# Patient Record
Sex: Male | Born: 1985 | Race: Black or African American | Hispanic: No | Marital: Single | Smoking: Current every day smoker
Health system: Southern US, Community
[De-identification: ages and names within clinical notes are randomized; demographics above are authoritative.]

## PROBLEM LIST (undated history)

## (undated) DIAGNOSIS — L309 Dermatitis, unspecified: Secondary | ICD-10-CM

---
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
## 2007-09-07 ENCOUNTER — Emergency Department (HOSPITAL_COMMUNITY): Admission: EM | Admit: 2007-09-07 | Discharge: 2007-09-07 | Payer: Self-pay | Admitting: Emergency Medicine

## 2008-03-12 ENCOUNTER — Emergency Department (HOSPITAL_COMMUNITY): Admission: EM | Admit: 2008-03-12 | Discharge: 2008-03-12 | Payer: Self-pay | Admitting: Family Medicine

## 2009-04-25 ENCOUNTER — Emergency Department (HOSPITAL_COMMUNITY): Admission: EM | Admit: 2009-04-25 | Discharge: 2009-04-25 | Payer: Self-pay | Admitting: Emergency Medicine

## 2011-10-03 LAB — I-STAT 8, (EC8 V) (CONVERTED LAB)
BUN: 6
Bicarbonate: 29.8 — ABNORMAL HIGH
Chloride: 104
HCT: 46
Hemoglobin: 15.6
Operator id: 294501
Potassium: 4.1
Sodium: 140

## 2011-10-03 LAB — URINALYSIS, ROUTINE W REFLEX MICROSCOPIC
Bilirubin Urine: NEGATIVE
Glucose, UA: NEGATIVE
Hgb urine dipstick: NEGATIVE
Ketones, ur: NEGATIVE
Protein, ur: NEGATIVE
pH: 5.5

## 2014-06-18 ENCOUNTER — Emergency Department (HOSPITAL_COMMUNITY)
Admission: EM | Admit: 2014-06-18 | Discharge: 2014-06-18 | Discharge status: Against Medical Advice | Payer: Self-pay | Attending: Emergency Medicine | Admitting: Emergency Medicine

## 2014-06-18 DIAGNOSIS — H5710 Ocular pain, unspecified eye: Secondary | ICD-10-CM | POA: Insufficient documentation

## 2015-03-29 ENCOUNTER — Emergency Department (HOSPITAL_COMMUNITY)
Admission: EM | Admit: 2015-03-29 | Discharge: 2015-03-29 | Disposition: A | Discharge status: Home / Self Care | Payer: Self-pay | Attending: Emergency Medicine | Admitting: Emergency Medicine

## 2015-03-29 ENCOUNTER — Encounter (HOSPITAL_COMMUNITY): Payer: Self-pay | Admitting: Emergency Medicine

## 2015-03-29 DIAGNOSIS — Z720 Tobacco use: Secondary | ICD-10-CM | POA: Insufficient documentation

## 2015-03-29 DIAGNOSIS — X58XXXA Exposure to other specified factors, initial encounter: Secondary | ICD-10-CM | POA: Insufficient documentation

## 2015-03-29 DIAGNOSIS — Y9289 Other specified places as the place of occurrence of the external cause: Secondary | ICD-10-CM | POA: Insufficient documentation

## 2015-03-29 DIAGNOSIS — S29012A Strain of muscle and tendon of back wall of thorax, initial encounter: Secondary | ICD-10-CM | POA: Insufficient documentation

## 2015-03-29 DIAGNOSIS — Y9389 Activity, other specified: Secondary | ICD-10-CM | POA: Insufficient documentation

## 2015-03-29 DIAGNOSIS — S233XXA Sprain of ligaments of thoracic spine, initial encounter: Secondary | ICD-10-CM | POA: Insufficient documentation

## 2015-03-29 DIAGNOSIS — Y990 Civilian activity done for income or pay: Secondary | ICD-10-CM | POA: Insufficient documentation

## 2015-03-29 LAB — URINALYSIS, ROUTINE W REFLEX MICROSCOPIC
Bilirubin Urine: NEGATIVE
Glucose, UA: NEGATIVE mg/dL
Hgb urine dipstick: NEGATIVE
Ketones, ur: 15 mg/dL — AB
Leukocytes, UA: NEGATIVE
Nitrite: NEGATIVE
Protein, ur: NEGATIVE mg/dL
Specific Gravity, Urine: 1.028 (ref 1.005–1.030)
Urobilinogen, UA: 0.2 mg/dL (ref 0.0–1.0)
pH: 5.5 (ref 5.0–8.0)

## 2015-03-29 MED ORDER — CYCLOBENZAPRINE HCL 10 MG PO TABS: 10.0000 mg | ORAL_TABLET | Freq: Two times a day (BID) | ORAL | Status: DC | PRN

## 2015-03-29 NOTE — ED Notes (Signed)
The patient said he has been having back pain for about 8 months.  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  He rates his pain 10/10.  He denies any urinary symptoms.

## 2015-03-29 NOTE — ED Provider Notes (Signed)
CSN: [id number]     Arrival date & time 03/29/15  0238 History   First MD Initiated Contact with Patient 03/29/15 [phone]     Chief Complaint  Patient presents with  . Back Pain    The patient said he has been having back pain for about 8 months.  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  He rates his pain 10/10.     (Consider location/radiation/quality/duration/timing/severity/associated sxs/prior Treatment) Patient is a 29 y.o. male presenting with back pain.  Back Pain Location:  Thoracic spine Quality:  Aching and shooting Radiates to: intermittently to bil arms. Pain severity:  Moderate Pain is:  Worse during the night (when patient works) Onset quality:  Gradual Duration:  8 months Timing:  Intermittent Progression:  Unchanged Chronicity:  Chronic Context comment:  Works at a job with heavy lifting and bending frequently Relieved by: days off. Worsened by:  Movement, standing, sitting, bending and deep breathing Ineffective treatments:  None tried Associated symptoms: no abdominal pain, no bladder incontinence, no bowel incontinence, no chest pain, no fever, no headaches, no numbness, no paresthesias, no perianal numbness and no tingling     History reviewed. No pertinent past medical history. History reviewed. No pertinent past surgical history. History reviewed. No pertinent family history. History  Substance Use Topics  . Smoking status: Current Every Day Smoker    Types: Cigarettes  . Smokeless tobacco: Not on file  . Alcohol Use: Yes     Comment: occ    Review of Systems  Constitutional: Negative for fever.  Cardiovascular: Negative for chest pain.  Gastrointestinal: Negative for abdominal pain and bowel incontinence.  Genitourinary: Negative for bladder incontinence.  Musculoskeletal: Positive for back pain.  Neurological: Negative for tingling, numbness, headaches and paresthesias.  All  other systems reviewed and are negative.     Allergies  Review of patient's allergies indicates not on file.  Home Medications   Prior to Admission medications   Medication Sig Start Date End Date Taking? Authorizing Provider  cyclobenzaprine (FLEXERIL) 10 MG tablet Take 1 tablet (10 mg total) by mouth 2 (two) times daily as needed for muscle spasms. 03/29/15   Mirian Mo, MD   BP 128/61 mmHg  Pulse 60  Temp(Src) 98.1 F (36.7 C) (Oral)  Resp 16  SpO2 97% Physical Exam  Constitutional: He is oriented to person, place, and time. He appears well-developed and well-nourished.  HENT:  Head: Normocephalic and atraumatic.  Eyes: Conjunctivae and EOM are normal.  Neck: Normal range of motion. Neck supple.  Cardiovascular: Normal rate, regular rhythm and normal heart sounds.   Pulmonary/Chest: Effort normal and breath sounds normal. No respiratory distress.  Abdominal: He exhibits no distension. There is no tenderness. There is no rebound and no guarding.  Musculoskeletal: Normal range of motion.       Cervical back: Normal.       Thoracic back: He exhibits tenderness (R paraspinal).       Lumbar back: Normal.  Neurological: He is alert and oriented to person, place, and time.  Skin: Skin is warm and dry.  Vitals reviewed.   ED Course  Procedures (including critical care time) Labs Review Labs Reviewed  URINALYSIS, ROUTINE W REFLEX MICROSCOPIC - Abnormal; Notable for the following:    Ketones, ur 15 (*)    All other components within normal limits    Imaging Review No results found.   EKG  Interpretation None      MDM   Final diagnoses:  Thoracic sprain and strain, initial encounter    29 y.o. male without pertinent PMH presents with back pain as above.  Symptoms MSK: worse with movement, palpation, improve with rest.  No pain while sitting still.  No significant change to prompt visit today.  Doubt acs, pe, pyelo, PNA, PTX, cauda equina, cord pathology given  chronic nature and clear MSK origin without systemic symptoms.  DC home in stable condition with standard return precautions.    I have reviewed all laboratory and imaging studies if ordered as above  1. Thoracic sprain and strain, initial encounter         Mirian MoMatthew Yetunde Leis, MD 03/29/15 0500

## 2015-03-29 NOTE — Discharge Instructions (Signed)
Back Pain, Adult Low back pain is very common. About 1 in 5 people have back pain.The cause of low back pain is rarely dangerous. The pain often gets better over time.About half of people with a sudden onset of back pain feel better in just 2 weeks. About 8 in 10 people feel better by 6 weeks.  CAUSES Some common causes of back pain include:  Strain of the muscles or ligaments supporting the spine.  Wear and tear (degeneration) of the spinal discs.  Arthritis.  Direct injury to the back. DIAGNOSIS Most of the time, the direct cause of low back pain is not known.However, back pain can be treated effectively even when the exact cause of the pain is unknown.Answering your caregiver's questions about your overall health and symptoms is one of the most accurate ways to make sure the cause of your pain is not dangerous. If your caregiver needs more information, he or she may order lab work or imaging tests (X-rays or MRIs).However, even if imaging tests show changes in your back, this usually does not require surgery. HOME CARE INSTRUCTIONS For many people, back pain returns.Since low back pain is rarely dangerous, it is often a condition that people can learn to manageon their own.   Remain active. It is stressful on the back to sit or stand in one place. Do not sit, drive, or stand in one place for more than 30 minutes at a time. Take short walks on level surfaces as soon as pain allows.Try to increase the length of time you walk each day.  Do not stay in bed.Resting more than 1 or 2 days can delay your recovery.  Do not avoid exercise or work.Your body is made to move.It is not dangerous to be active, even though your back may hurt.Your back will likely heal faster if you return to being active before your pain is gone.  Pay attention to your body when you bend and lift. Many people have less discomfortwhen lifting if they bend their knees, keep the load close to their bodies,and  avoid twisting. Often, the most comfortable positions are those that put less stress on your recovering back.  Find a comfortable position to sleep. Use a firm mattress and lie on your side with your knees slightly bent. If you lie on your back, put a pillow under your knees.  Only take over-the-counter or prescription medicines as directed by your caregiver. Over-the-counter medicines to reduce pain and inflammation are often the most helpful.Your caregiver may prescribe muscle relaxant drugs.These medicines help dull your pain so you can more quickly return to your normal activities and healthy exercise.  Put ice on the injured area.  Put ice in a plastic bag.  Place a towel between your skin and the bag.  Leave the ice on for 15-20 minutes, 03-04 times a day for the first 2 to 3 days. After that, ice and heat may be alternated to reduce pain and spasms.  Ask your caregiver about trying back exercises and gentle massage. This may be of some benefit.  Avoid feeling anxious or stressed.Stress increases muscle tension and can worsen back pain.It is important to recognize when you are anxious or stressed and learn ways to manage it.Exercise is a great option. SEEK MEDICAL CARE IF:  You have pain that is not relieved with rest or medicine.  You have pain that does not improve in 1 week.  You have new symptoms.  You are generally not feeling well. SEEK   IMMEDIATE MEDICAL CARE IF:   You have pain that radiates from your back into your legs.  You develop new bowel or bladder control problems.  You have unusual weakness or numbness in your arms or legs.  You develop nausea or vomiting.  You develop abdominal pain.  You feel faint. Document Released: 12/09/2005 Document Revised: 06/09/2012 Document Reviewed: 04/12/2014 Exit[employer] Patient Information 2015 [employer]. This information is not intended to replace advice given to you by your health care provider. Make sure you  discuss any questions you have with your health care provider.  

## 2015-07-01 ENCOUNTER — Encounter (HOSPITAL_COMMUNITY): Payer: Self-pay | Admitting: Emergency Medicine

## 2015-07-01 ENCOUNTER — Emergency Department (INDEPENDENT_AMBULATORY_CARE_PROVIDER_SITE_OTHER)
Admission: EM | Admit: 2015-07-01 | Discharge: 2015-07-01 | Disposition: A | Discharge status: Home / Self Care | Payer: Self-pay | Source: Home / Self Care

## 2015-07-01 DIAGNOSIS — S40022A Contusion of left upper arm, initial encounter: Secondary | ICD-10-CM

## 2015-07-01 DIAGNOSIS — Z23 Encounter for immunization: Secondary | ICD-10-CM

## 2015-07-01 DIAGNOSIS — M79644 Pain in right finger(s): Secondary | ICD-10-CM

## 2015-07-01 DIAGNOSIS — S61219A Laceration without foreign body of unspecified finger without damage to nail, initial encounter: Secondary | ICD-10-CM

## 2015-07-01 MED ORDER — TETANUS-DIPHTH-ACELL PERTUSSIS 5-2.5-18.5 LF-MCG/0.5 IM SUSP
0.5000 mL | Freq: Once | INTRAMUSCULAR | Status: AC
  Administered 2015-07-01: 0.5 mL via INTRAMUSCULAR

## 2015-07-01 MED ORDER — CEPHALEXIN 500 MG PO CAPS: 500.0000 mg | ORAL_CAPSULE | Freq: Three times a day (TID) | ORAL | Status: DC

## 2015-07-01 MED ORDER — MUPIROCIN 2 % EX OINT: 1.0000 "application " | TOPICAL_OINTMENT | Freq: Three times a day (TID) | CUTANEOUS | Status: DC
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
## 2016-02-21 ENCOUNTER — Emergency Department (INDEPENDENT_AMBULATORY_CARE_PROVIDER_SITE_OTHER)
Admission: EM | Admit: 2016-02-21 | Discharge: 2016-02-21 | Disposition: A | Discharge status: Home / Self Care | Payer: Self-pay | Source: Home / Self Care | Attending: Family Medicine | Admitting: Family Medicine

## 2016-02-21 ENCOUNTER — Encounter (HOSPITAL_COMMUNITY): Payer: Self-pay | Admitting: *Deleted

## 2016-02-21 DIAGNOSIS — R69 Illness, unspecified: Principal | ICD-10-CM

## 2016-02-21 DIAGNOSIS — J111 Influenza due to unidentified influenza virus with other respiratory manifestations: Secondary | ICD-10-CM

## 2016-02-21 MED ORDER — ONDANSETRON HCL 4 MG PO TABS: 4.0000 mg | ORAL_TABLET | Freq: Four times a day (QID) | ORAL | Status: DC

## 2016-02-21 MED ORDER — IBUPROFEN 800 MG PO TABS: 800.0000 mg | ORAL_TABLET | Freq: Three times a day (TID) | ORAL | Status: DC | PRN

## 2016-02-21 NOTE — ED Notes (Signed)
Not   In  wr

## 2016-02-21 NOTE — ED Notes (Signed)
Pt  Reports  Body  Aches          Chills  Pain  In  Sides  And  Back  With  Onset of  Symptoms  X  3  Days  Ago   -    Pt sitting  Upright on the  Exam table   Speaking in  Complete  sentances

## 2016-02-21 NOTE — ED Provider Notes (Signed)
CSN: [id number]     Arrival date & time 02/21/16  1846 History   First MD Initiated Contact with Patient 02/21/16 1942     Chief Complaint  Patient presents with  . Generalized Body Aches   (Consider location/radiation/quality/duration/timing/severity/associated sxs/prior Treatment) Patient is a 30 y.o. male presenting with flu symptoms. The history is provided by the patient.  Influenza Presenting symptoms: cough, fatigue, fever, myalgias, nausea and vomiting   Presenting symptoms: no shortness of breath   Severity:  Moderate Onset quality:  Gradual Duration:  4 days Progression:  Worsening Chronicity:  New Relieved by:  None tried Worsened by:  Nothing tried Associated symptoms: chills, decreased appetite and nasal congestion     History reviewed. No pertinent past medical history. History reviewed. No pertinent past surgical history. History reviewed. No pertinent family history. Social History  Substance Use Topics  . Smoking status: Current Every Day Smoker    Types: Cigarettes  . Smokeless tobacco: None  . Alcohol Use: Yes     Comment: occ    Review of Systems  Constitutional: Positive for fever, chills, appetite change, fatigue and decreased appetite. Negative for activity change.  HENT: Positive for congestion and postnasal drip.   Respiratory: Positive for cough. Negative for shortness of breath.   Cardiovascular: Negative.   Gastrointestinal: Positive for nausea and vomiting.  Genitourinary: Negative.   Musculoskeletal: Positive for myalgias.  All other systems reviewed and are negative.   Allergies  Review of patient's allergies indicates no known allergies.  Home Medications   Prior to Admission medications   Medication Sig Start Date End Date Taking? Authorizing Provider  cephALEXin (KEFLEX) 500 MG capsule Take 1 capsule (500 mg total) by mouth 3 (three) times daily. 07/01/15   Elvina Sidle, MD  cyclobenzaprine (FLEXERIL) 10 MG tablet Take 1 tablet  (10 mg total) by mouth 2 (two) times daily as needed for muscle spasms. 03/29/15   Mirian Mo, MD  ibuprofen (ADVIL,MOTRIN) 800 MG tablet Take 1 tablet (800 mg total) by mouth every 8 (eight) hours as needed. For fever and aching with influenza 02/21/16   Linna Hoff, MD  mupirocin ointment (BACTROBAN) 2 % Apply 1 application topically 3 (three) times daily. 07/01/15   Elvina Sidle, MD  ondansetron (ZOFRAN) 4 MG tablet Take 1 tablet (4 mg total) by mouth every 6 (six) hours. Prn n/v. 02/21/16   Linna Hoff, MD   Meds Ordered and Administered this Visit  Medications - No data to display  BP 128/82 mmHg  Pulse 112  Temp(Src) 99.6 F (37.6 C) (Oral)  Resp 18  SpO2 100% No data found.   Physical Exam  Constitutional: He is oriented to person, place, and time. He appears well-developed and well-nourished. No distress.  HENT:  Right Ear: External ear normal.  Left Ear: External ear normal.  Mouth/Throat: Oropharynx is clear and moist.  Neck: Normal range of motion. Neck supple.  Cardiovascular: Normal rate, normal heart sounds and intact distal pulses.   Pulmonary/Chest: Effort normal and breath sounds normal.  Abdominal: Soft. Bowel sounds are normal. There is no tenderness.  Lymphadenopathy:    He has no cervical adenopathy.  Neurological: He is alert and oriented to person, place, and time.  Skin: Skin is warm and dry.  Nursing note and vitals reviewed.   ED Course  Procedures (including critical care time)  Labs Review Labs Reviewed - No data to display  Imaging Review No results found.   Visual Acuity Review  Right  Eye Distance:   Left Eye Distance:   Bilateral Distance:    Right Eye Near:   Left Eye Near:    Bilateral Near:         MDM   1. Influenza-like illness        Linna Hoff, MD 02/21/16 [phone]

## 2016-10-15 ENCOUNTER — Emergency Department (HOSPITAL_COMMUNITY)
Admission: EM | Admit: 2016-10-15 | Discharge: 2016-10-15 | Disposition: A | Discharge status: Home / Self Care | Payer: Self-pay | Attending: Emergency Medicine | Admitting: Emergency Medicine

## 2016-10-15 ENCOUNTER — Emergency Department (HOSPITAL_COMMUNITY): Payer: Self-pay

## 2016-10-15 ENCOUNTER — Encounter (HOSPITAL_COMMUNITY): Payer: Self-pay | Admitting: Emergency Medicine

## 2016-10-15 DIAGNOSIS — Y9389 Activity, other specified: Secondary | ICD-10-CM | POA: Insufficient documentation

## 2016-10-15 DIAGNOSIS — S62515A Nondisplaced fracture of proximal phalanx of left thumb, initial encounter for closed fracture: Secondary | ICD-10-CM

## 2016-10-15 DIAGNOSIS — S62525A Nondisplaced fracture of distal phalanx of left thumb, initial encounter for closed fracture: Secondary | ICD-10-CM | POA: Insufficient documentation

## 2016-10-15 DIAGNOSIS — Y929 Unspecified place or not applicable: Secondary | ICD-10-CM | POA: Insufficient documentation

## 2016-10-15 DIAGNOSIS — Y999 Unspecified external cause status: Secondary | ICD-10-CM | POA: Insufficient documentation

## 2016-10-15 DIAGNOSIS — W228XXA Striking against or struck by other objects, initial encounter: Secondary | ICD-10-CM | POA: Insufficient documentation

## 2016-10-15 MED ORDER — HYDROCODONE-ACETAMINOPHEN 5-325 MG PO TABS
1.0000 | ORAL_TABLET | Freq: Once | ORAL | Status: AC
  Administered 2016-10-15: 1 via ORAL
  Filled 2016-10-15: qty 1

## 2016-10-15 MED ORDER — HYDROCODONE-ACETAMINOPHEN 5-325 MG PO TABS: 1.0000 | ORAL_TABLET | ORAL | 0 refills | Status: DC | PRN

## 2016-10-15 NOTE — ED Notes (Signed)
Ortho tech paged  

## 2016-10-15 NOTE — ED Triage Notes (Signed)
Patient injured his left hand after he hit a glass cup this evening , presents with swelling/pain at left hand , skin intact .

## 2016-10-15 NOTE — ED Provider Notes (Signed)
[facility]-EMERGENCY DEPT Provider Note   CSN: [id number] Arrival date & time: 10/15/16  0015     History   Chief Complaint Chief Complaint  Patient presents with  . Hand Injury    HPI Derrick Hurst is a 30 y.o. male.  Derrick Hurst is a 30 y.o. male presents to ED with complaint of left hand pain and swelling s/p injury. Patient reports this evening that he  "slammed" a glass cup on the counter and had subsequent pain and swelling in left thumb with radiation into left wrist. Pain is constant described as an "electric/burning sensation." No aggravating or alleviating factors. He tried applying frozen hamburger meat to area with minimal relief.  He also notes bruising to the palmar surface of his left hand. He endorses paresthesias and weakness. Denies fever, warmth, or wound. Patient is right hand dominant.      History reviewed. No pertinent past medical history.  There are no active problems to display for this patient.   History reviewed. No pertinent surgical history.     Home Medications    Prior to Admission medications   Medication Sig Start Date End Date Taking? Authorizing Provider  cephALEXin (KEFLEX) 500 MG capsule Take 1 capsule (500 mg total) by mouth 3 (three) times daily. 07/01/15   Elvina Sidle, MD  cyclobenzaprine (FLEXERIL) 10 MG tablet Take 1 tablet (10 mg total) by mouth 2 (two) times daily as needed for muscle spasms. 03/29/15   Mirian Mo, MD  HYDROcodone-acetaminophen (NORCO/VICODIN) 5-325 MG tablet Take 1 tablet by mouth every 4 (four) hours as needed. 10/15/16   Lona Kettle, PA-C  ibuprofen (ADVIL,MOTRIN) 800 MG tablet Take 1 tablet (800 mg total) by mouth every 8 (eight) hours as needed. For fever and aching with influenza 02/21/16   Linna Hoff, MD  mupirocin ointment (BACTROBAN) 2 % Apply 1 application topically 3 (three) times daily. 07/01/15   Elvina Sidle, MD  ondansetron (ZOFRAN) 4 MG tablet Take 1 tablet (4 mg total) by mouth  every 6 (six) hours. Prn n/v. 02/21/16   Linna Hoff, MD    Family History No family history on file.  Social History Social History  Substance Use Topics  . Smoking status: Current Every Day Smoker    Types: Cigarettes  . Smokeless tobacco: Not on file  . Alcohol use Yes     Comment: occ     Allergies   Review of patient's allergies indicates no known allergies.   Review of Systems Review of Systems  Constitutional: Negative for fever.  Musculoskeletal: Positive for arthralgias and joint swelling.  Skin: Positive for color change. Negative for wound.  Neurological: Positive for weakness. Negative for numbness.       Paresthesias, "electric shock sensation"     Physical Exam Updated Vital Signs BP 147/91 (BP Location: Right Arm)   Pulse 83   Temp 97.9 F (36.6 C) (Oral)   Resp 18   Ht 5\' 10"  (1.778 m)   Wt 81.6 kg   SpO2 100%   BMI 25.83 kg/m   Physical Exam  Constitutional: He appears well-developed and well-nourished. No distress.  HENT:  Head: Normocephalic and atraumatic.  Eyes: Conjunctivae are normal. No scleral icterus.  Neck: Normal range of motion.  Pulmonary/Chest: Effort normal. No respiratory distress.  Musculoskeletal:       Left hand: He exhibits tenderness and swelling.  Left hand: swelling appreciated most significantly at thumb; ecchymosis noted to palmer surface at thenar eminence; no  open wounds. Diffuse TTP of left hand and wrist. Limited ROM and strength assessment secondary to pain. Sensation intact. Capillary refill <3seconds.   Neurological: He is alert.  Skin: Skin is warm and dry. He is not diaphoretic.  Psychiatric: He has a normal mood and affect. His behavior is normal.     ED Treatments / Results  Labs (all labs ordered are listed, but only abnormal results are displayed) Labs Reviewed - No data to display  EKG  EKG Interpretation None       Radiology Dg Hand Complete Left  Result Date: 10/15/2016 CLINICAL DATA:   Injury pain at the thumb. EXAM: LEFT HAND - COMPLETE 3+ VIEW COMPARISON:  04/25/2009 FINDINGS: There is a mildly comminuted intra-articular fracture involving the base of the first proximal phalanx. No angulation. No subluxation. No radiopaque foreign body. IMPRESSION: Comminuted intra-articular fracture involving the base of the first proximal phalanx Electronically Signed   By: Jasmine PangKim  Fujinaga M.D.   On: 10/15/2016 02:33    Procedures Procedures (including critical care time)  Medications Ordered in ED Medications  HYDROcodone-acetaminophen (NORCO/VICODIN) 5-325 MG per tablet 1 tablet (1 tablet Oral Given 10/15/16 0142)     Initial Impression / Assessment and Plan / ED Course  I have reviewed the triage vital signs and the nursing notes.  Pertinent labs & imaging results that were available during my care of the patient were reviewed by me and considered in my medical decision making (see chart for details).  Clinical Course  Value Comment By Time  DG Hand Complete Left Fracture at base of 1st proximal phalanx.  Lona Kettleshley Laurel Meri Pelot, [place]-C 10/24 [phone]    Patient presents to ED with complaint of left hand pain s/p hitting a glass. Patient is afebrile and non-toxic appearing in NAD. Vital signs remarkable for mildly elevated blood pressure, otherwise stable. Swelling to left hand, moreso at left thumb. Ecchymosis appreciated at left thenar eminence. Diffuse TTP of hand. Limited ROM and strength secondary to pain. Neurovascularly intact.  X-ray remarkable for comminuted intra-articular fracture at base of first proximal phalanx. Discussed results and plan with patient. Patient placed in splint and arm sling. Neurovascularly intact following splint placement. Close follow up with ortho, call in am. RICE therapy and pain management discussed. Rx vicodin for severe pain. Review of [place] controlled substance database shows no recent narcotic rx. Return precautions given. Pt voiced understanding and is  agreeable.    Final Clinical Impressions(s) / ED Diagnoses   Final diagnoses:  Closed nondisplaced fracture of proximal phalanx of left thumb, initial encounter    New Prescriptions New Prescriptions   HYDROCODONE-ACETAMINOPHEN (NORCO/VICODIN) 5-325 MG TABLET    Take 1 tablet by mouth every 4 (four) hours as needed.     Lona KettleAshley Laurel Evett Kassa, PA-C 10/15/16 [phone]    Tomasita CrumbleAdeleke Oni, MD 10/15/16 [phone]

## 2016-10-15 NOTE — ED Notes (Signed)
Pt verbalized understanding discharge instructions and denies any further needs or questions at this time. VS stable, ambulatory and steady gait.   

## 2016-10-15 NOTE — ED Notes (Signed)
EDP at bedside  

## 2016-10-15 NOTE — Discharge Instructions (Signed)
Read the information below.  You have a fracture in your thumb. You are being placed in a splint ad given an arm sling. It is very important that you follow up with Hand Surgery, please call in the morning for further management.  You can ice and elevated for 20 minute increments for the next 48 hours.  You can take motrin 400mg  every 6hrs for mild to moderate pain. I have prescribed vicodin for severe pain.  Use the prescribed medication as directed. Please discuss all new medications with your pharmacist.   You may return to the Emergency Department at any time for worsening condition or any new symptoms that concern you. Return to ED if you develop worsening swelling, uncontrolled pain, discoloration in your hands, or loss of sensation.

## 2017-04-18 ENCOUNTER — Encounter (HOSPITAL_BASED_OUTPATIENT_CLINIC_OR_DEPARTMENT_OTHER): Payer: Self-pay | Admitting: *Deleted

## 2017-04-18 ENCOUNTER — Emergency Department (HOSPITAL_BASED_OUTPATIENT_CLINIC_OR_DEPARTMENT_OTHER): Payer: Self-pay

## 2017-04-18 ENCOUNTER — Emergency Department (HOSPITAL_BASED_OUTPATIENT_CLINIC_OR_DEPARTMENT_OTHER)
Admission: EM | Admit: 2017-04-18 | Discharge: 2017-04-19 | Disposition: A | Discharge status: Home / Self Care | Payer: Self-pay | Attending: Emergency Medicine | Admitting: Emergency Medicine

## 2017-04-18 DIAGNOSIS — S6391XA Sprain of unspecified part of right wrist and hand, initial encounter: Secondary | ICD-10-CM | POA: Insufficient documentation

## 2017-04-18 DIAGNOSIS — Y999 Unspecified external cause status: Secondary | ICD-10-CM | POA: Insufficient documentation

## 2017-04-18 DIAGNOSIS — W500XXA Accidental hit or strike by another person, initial encounter: Secondary | ICD-10-CM | POA: Insufficient documentation

## 2017-04-18 DIAGNOSIS — Y9389 Activity, other specified: Secondary | ICD-10-CM | POA: Insufficient documentation

## 2017-04-18 DIAGNOSIS — F17210 Nicotine dependence, cigarettes, uncomplicated: Secondary | ICD-10-CM | POA: Insufficient documentation

## 2017-04-18 DIAGNOSIS — Y92149 Unspecified place in prison as the place of occurrence of the external cause: Secondary | ICD-10-CM | POA: Insufficient documentation
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
## 2017-04-18 NOTE — ED Notes (Signed)
ED Provider at bedside. 
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
## 2017-04-19 MED ORDER — HYDROCODONE-ACETAMINOPHEN 5-325 MG PO TABS
1.0000 | ORAL_TABLET | Freq: Once | ORAL | Status: AC
Start: 2017-04-19 — End: 2017-04-19
  Administered 2017-04-19: 1 via ORAL
  Filled 2017-04-19: qty 1

## 2017-04-19 MED ORDER — IBUPROFEN 800 MG PO TABS
800.0000 mg | ORAL_TABLET | Freq: Once | ORAL | Status: AC
  Administered 2017-04-19: 800 mg via ORAL
  Filled 2017-04-19: qty 1

## 2017-04-19 MED ORDER — IBUPROFEN 600 MG PO TABS: 600.0000 mg | ORAL_TABLET | Freq: Three times a day (TID) | ORAL | 0 refills | Status: DC | PRN

## 2017-04-19 NOTE — ED Notes (Signed)
Pt tolerated well. PMS intact before and after.

## 2017-04-19 NOTE — ED Notes (Signed)
Pt discharged to home NAD.  

## 2018-03-26 ENCOUNTER — Emergency Department (HOSPITAL_BASED_OUTPATIENT_CLINIC_OR_DEPARTMENT_OTHER)
Admission: EM | Admit: 2018-03-26 | Discharge: 2018-03-26 | Disposition: A | Discharge status: Home / Self Care | Payer: Self-pay | Attending: Emergency Medicine | Admitting: Emergency Medicine

## 2018-03-26 ENCOUNTER — Encounter (HOSPITAL_BASED_OUTPATIENT_CLINIC_OR_DEPARTMENT_OTHER): Payer: Self-pay | Admitting: *Deleted

## 2018-03-26 ENCOUNTER — Other Ambulatory Visit: Payer: Self-pay

## 2018-03-26 DIAGNOSIS — F17210 Nicotine dependence, cigarettes, uncomplicated: Secondary | ICD-10-CM | POA: Insufficient documentation

## 2018-03-26 DIAGNOSIS — R112 Nausea with vomiting, unspecified: Secondary | ICD-10-CM | POA: Insufficient documentation

## 2018-03-26 DIAGNOSIS — R197 Diarrhea, unspecified: Secondary | ICD-10-CM | POA: Insufficient documentation

## 2018-03-26 MED ORDER — SODIUM CHLORIDE 0.9 % IV BOLUS
1000.0000 mL | Freq: Once | INTRAVENOUS | Status: AC
  Administered 2018-03-26: 1000 mL via INTRAVENOUS

## 2018-03-26 MED ORDER — ONDANSETRON 4 MG PO TBDP: 4.0000 mg | ORAL_TABLET | Freq: Three times a day (TID) | ORAL | 0 refills | Status: DC | PRN

## 2018-03-26 MED ORDER — ONDANSETRON HCL 4 MG/2ML IJ SOLN
4.0000 mg | Freq: Once | INTRAMUSCULAR | Status: AC
  Administered 2018-03-26: 4 mg via INTRAVENOUS
  Filled 2018-03-26: qty 2

## 2018-03-26 NOTE — ED Notes (Signed)
Pt. Feeling better and nausea is better now.

## 2018-03-26 NOTE — Discharge Instructions (Signed)
You were seen in the emergency department with N/V/D and abdominal discomfort. Your symptoms improved following fluids and zofran (anti-nausea medication). We suspect that your symptoms are likely related to a viral GI bug. Your symptoms should gradually improve. We have given you a prescription for zofran, this is the anti-nausea medication we gave you in the ER. You may take zofran once every 8 hours as needed for vomiting. Place the tablet underneath of your tongue and allow it to dissolve.   We have prescribed you new medication(s) today. Discuss the medications prescribed today with your pharmacist as they can have adverse effects and interactions with your other medicines including over the counter and prescribed medications. Seek medical evaluation if you start to experience new or abnormal symptoms after taking one of these medicines, seek care immediately if you start to experience difficulty breathing, feeling of your throat closing, facial swelling, or rash as these could be indications of a more serious allergic reaction.   Follow up with your primary care doctor in 3-5 days for re-evaluation. Return to the ER for any new or worsening symptoms including, but not limited to fever, inability to keep down fluids, blood in your stool, worsening or localizing abdominal pain, or any other concerns you may have.

## 2018-03-26 NOTE — ED Triage Notes (Signed)
He woke with diarrhea and vomiting at 3am. Abdominal pain a few hours later.

## 2018-03-26 NOTE — ED Provider Notes (Signed)
[facility] EMERGENCY DEPARTMENT Provider Note   CSN: [id number] Arrival date & time: 03/26/18  1731     History   Chief Complaint Chief Complaint  Patient presents with  . Abdominal Pain  . Emesis    HPI Derrick Hurst is a 32 y.o. male with a hx of tobacco abuse who presents to the ED with complaint of N/V/D that began this AM. Patient states that he woke from sleep and has had several episodes of emesis and diarrhea, non bloody. States that mid way throughout the day he developed diffuse abdominal discomfort that is a cramping sensation, states he feels as though his stomach has been emptied which has lead to the pain. Rates his overall discomfort with his sxs a 9/10 in severity. Abdominal discomfort is worsened by the vomiting and diarrhea. Denies fever, testicular pain/swelling, blood in stool, dysuria,or testicular pain/swelling. He has had not had any EtOH intake over past few days. No increase in NSAID use. No recent long travel or abx therapy.  Last PO intake was [employer] last evening.   HPI  History reviewed. No pertinent past medical history.  There are no active problems to display for this patient.   History reviewed. No pertinent surgical history.      Home Medications    Prior to Admission medications   Medication Sig Start Date End Date Taking? Authorizing Provider  ibuprofen (ADVIL,MOTRIN) 600 MG tablet Take 1 tablet (600 mg total) by mouth every 8 (eight) hours as needed for moderate pain. 04/19/17   Zadie RhineWickline, Donald, MD    Family History History reviewed. No pertinent family history.  Social History Social History   Tobacco Use  . Smoking status: Current Every Day Smoker    Types: Cigarettes  . Smokeless tobacco: Never Used  Substance Use Topics  . Alcohol use: Yes    Comment: occ  . Drug use: Yes    Types: Marijuana    Comment: everyday     Allergies   Patient has no known allergies.   Review of Systems Review of Systems    Constitutional: Negative for chills and fever.  Respiratory: Negative for shortness of breath.   Cardiovascular: Negative for chest pain.  Gastrointestinal: Positive for abdominal pain, diarrhea, nausea and vomiting. Negative for blood in stool and constipation.  Genitourinary: Negative for dysuria, scrotal swelling and testicular pain.  All other systems reviewed and are negative.    Physical Exam Updated Vital Signs BP 127/76   Pulse 70   Temp 98.8 F (37.1 C) (Oral)   Resp 20   Ht 5\' 11"  (1.803 m)   Wt 81.6 kg (180 lb)   SpO2 98%   BMI 25.10 kg/m   Physical Exam  Constitutional: He appears well-developed and well-nourished. No distress.  HENT:  Head: Normocephalic and atraumatic.  Mucous membranes are somewhat dry.   Eyes: Conjunctivae are normal. Right eye exhibits no discharge. Left eye exhibits no discharge.  Cardiovascular: Normal rate and regular rhythm.  No murmur heard. Pulmonary/Chest: Breath sounds normal. No respiratory distress. He has no wheezes. He has no rales.  Abdominal: Soft. He exhibits no distension. There is tenderness (very mild diffuse, non focal). There is no rigidity, no rebound, no guarding, no CVA tenderness and no tenderness at McBurney's point.  Neurological: He is alert.  Clear speech.   Skin: Skin is warm and dry. No rash noted.  Psychiatric: He has a normal mood and affect. His behavior is normal.  Nursing note and  vitals reviewed.   ED Treatments / Results  Labs (all labs ordered are listed, but only abnormal results are displayed) Labs Reviewed - No data to display  EKG None  Radiology No results found.  Procedures Procedures (including critical care time)  Medications Ordered in ED Medications  sodium chloride 0.9 % bolus 1,000 mL (has no administration in time range)  ondansetron (ZOFRAN) injection 4 mg (has no administration in time range)   Initial Impression / Assessment and Plan / ED Course  I have reviewed the  triage vital signs and the nursing notes.  Pertinent labs & imaging results that were available during my care of the patient were reviewed by me and considered in my medical decision making (see chart for details).   Patient presents with N/V/D and subsequent abdominal discomfort. Patient is nontoxic appearing, in no apparent distress, vitals WNL. Patient with very minimal tenderness on abdominal exam, no peritoneal signs. H&P consistent with likely viral gastroenteritis will treat with fluids and zofran and re-assess.    19:20: RE-EVAL: Patient states he is feeling much better and ready to go home. Repeat abdominal exam is nontender. Will PO challenge  Patient is tolerating PO in the emergency department.  On repeat exam patient does not have a surgical abdomen and there are no peritoneal signs.  No indication of appendicitis, bowel obstruction, bowel perforation, cholecystitis, diverticulitis, or pancreatitis.  Will discharge home with a few tablets of zofran with PCP follow up. I discussed treatment plan, need for PCP follow-up, and return precautions with the patient. Provided opportunity for questions, patient confirmed understanding and is in agreement with plan.    Final Clinical Impressions(s) / ED Diagnoses   Final diagnoses:  Nausea vomiting and diarrhea    ED Discharge Orders        Ordered    ondansetron (ZOFRAN ODT) 4 MG disintegrating tablet  Every 8 hours PRN     03/26/18 1941       Zubayr Bednarczyk, Pleas Koch, PA-C 03/26/18 2338    Gwyneth Sprout, MD 03/28/18 [phone]

## 2018-03-26 NOTE — ED Notes (Signed)
Pt. Has had vomiting and diarrhea since 9am today.  Pt. Reports he ate [employer] lastnight ant this started this morning..the patient ate chicken and beef and cheese meals.

## 2018-12-21 ENCOUNTER — Emergency Department (HOSPITAL_BASED_OUTPATIENT_CLINIC_OR_DEPARTMENT_OTHER): Payer: Self-pay

## 2018-12-21 ENCOUNTER — Emergency Department (HOSPITAL_BASED_OUTPATIENT_CLINIC_OR_DEPARTMENT_OTHER)
Admission: EM | Admit: 2018-12-21 | Discharge: 2018-12-21 | Disposition: A | Discharge status: Home / Self Care | Payer: Self-pay | Attending: Emergency Medicine | Admitting: Emergency Medicine

## 2018-12-21 ENCOUNTER — Encounter (HOSPITAL_BASED_OUTPATIENT_CLINIC_OR_DEPARTMENT_OTHER): Payer: Self-pay | Admitting: *Deleted

## 2018-12-21 ENCOUNTER — Other Ambulatory Visit: Payer: Self-pay

## 2018-12-21 DIAGNOSIS — Y92310 Basketball court as the place of occurrence of the external cause: Secondary | ICD-10-CM | POA: Insufficient documentation

## 2018-12-21 DIAGNOSIS — S92155A Nondisplaced avulsion fracture (chip fracture) of left talus, initial encounter for closed fracture: Secondary | ICD-10-CM | POA: Insufficient documentation

## 2018-12-21 DIAGNOSIS — Y9367 Activity, basketball: Secondary | ICD-10-CM | POA: Insufficient documentation

## 2018-12-21 DIAGNOSIS — W502XXA Accidental twist by another person, initial encounter: Secondary | ICD-10-CM | POA: Insufficient documentation

## 2018-12-21 DIAGNOSIS — F17210 Nicotine dependence, cigarettes, uncomplicated: Secondary | ICD-10-CM | POA: Insufficient documentation

## 2018-12-21 DIAGNOSIS — Y999 Unspecified external cause status: Secondary | ICD-10-CM | POA: Insufficient documentation

## 2018-12-21 NOTE — ED Triage Notes (Signed)
Left ankle injury. He was tripped last night while playing basketball. Swelling to his ankle. Pain radiates into his heel and up his lower leg.

## 2018-12-21 NOTE — Discharge Instructions (Addendum)
You were seen in the ER for ankle pain.   X-ray showed a very slight irregularity around your talus. This could suggest an avulsion fracture.  We will treat this with immobilization (cam walker), rest, ice, elevation.  Rest for 2 days. After 2 days, start doing light range of motion exercises with ankle (ankle circles and flexion and extension) to avoid stiffness.   For pain take 600 mg ibuprofen every 6 hours or [phone] mg acetaminophen every 6 hours. For more pain control you can take both.   Follow up with foot specialist in 7-10 days for re-evaluation and determine duration of cam walker and any needed physical therapy or strengthening exercises.   Return for loss of sensation or coldness to toes, pain in your calf or calf swelling.

## 2018-12-21 NOTE — ED Provider Notes (Signed)
[facility] EMERGENCY DEPARTMENT Provider Note   CSN: [id number] Arrival date & time: 12/21/18  1055     History   Chief Complaint Chief Complaint  Patient presents with  . Ankle Injury    HPI Derrick Hurst is a 32 y.o. adult here for evaluation of sudden onset left ankle pain that occurred last night while he was playing basketball. States someone tripped him and he felt sudden pop in ankle.  Associated with swelling. Pain most at dorsal proximal ankle with radiation to leg. No interventions. Aggravated with weight bearing, palpation. Alleviated with rest. Denies fevers, redness, warmth, calf pain or swelling, distal paresthesias or tingling. No previous injuries to this joint.   HPI  History reviewed. No pertinent past medical history.  There are no active problems to display for this patient.   History reviewed. No pertinent surgical history.      Home Medications    Prior to Admission medications   Medication Sig Start Date End Date Taking? Authorizing Provider  ibuprofen (ADVIL,MOTRIN) 600 MG tablet Take 1 tablet (600 mg total) by mouth every 8 (eight) hours as needed for moderate pain. 04/19/17   Zadie RhineWickline, Donald, MD  ondansetron (ZOFRAN ODT) 4 MG disintegrating tablet Take 1 tablet (4 mg total) by mouth every 8 (eight) hours as needed for nausea or vomiting. 03/26/18   Petrucelli, Pleas KochSamantha R, PA-C    Family History No family history on file.  Social History Social History   Tobacco Use  . Smoking status: Current Every Day Smoker    Types: Cigarettes  . Smokeless tobacco: Never Used  Substance Use Topics  . Alcohol use: Yes    Comment: occ  . Drug use: Yes    Types: Marijuana    Comment: everyday     Allergies   Patient has no known allergies.   Review of Systems Review of Systems  Musculoskeletal: Positive for arthralgias and joint swelling.  All other systems reviewed and are negative.    Physical Exam Updated Vital Signs BP  121/76   Pulse 79   Temp 98.4 F (36.9 C) (Oral)   Resp 16   Ht 5\' 11"  (1.803 m)   Wt 85.3 kg   SpO2 99%   BMI 26.22 kg/m   Physical Exam Constitutional:      Appearance: She is well-developed. She is not toxic-appearing.  HENT:     Head: Normocephalic.     Right Ear: External ear normal.     Left Ear: External ear normal.     Nose: Nose normal.  Eyes:     Conjunctiva/sclera: Conjunctivae normal.  Neck:     Musculoskeletal: Full passive range of motion without pain.  Cardiovascular:     Rate and Rhythm: Normal rate.     Comments: 1+ DP and PT pulses bilaterally.  Toes warm bilaterally. No calf tenderness or asymmetric edema.  Pulmonary:     Effort: Pulmonary effort is normal. No tachypnea or respiratory distress.  Musculoskeletal: Normal range of motion.        General: Swelling and tenderness present.     Comments: Left ankle: focal tenderness and edema to dorsal talus and proximal mid foot. Decreased plantar flexion and dorsiflexion secondary to pain.  No surrounding erythema, warmth, fluctuance.  No bony tenderness over posterior aspect of lateral and medial malleoli, navicular bone or 5th metatarsal base.  Achilles tendon is non tender. Negative anterior and posterior drawer. Positive Talar tilt. Negative syndesmosis squeeze test. Negative Thompson test.  Skin:    General: Skin is warm and dry.     Capillary Refill: Capillary refill takes less than 2 seconds.  Neurological:     Mental Status: She is alert and oriented to person, place, and time.     Comments: Sensation to light touch intact in lower extremities   Psychiatric:        Behavior: Behavior normal.        Thought Content: Thought content normal.      ED Treatments / Results  Labs (all labs ordered are listed, but only abnormal results are displayed) Labs Reviewed - No data to display  EKG None  Radiology Dg Ankle Complete Left  Result Date: 12/21/2018 CLINICAL DATA:  Twisted ankle after playing  basketball last night. Ankle pain. Initial encounter. EXAM: LEFT ANKLE COMPLETE - 3+ VIEW COMPARISON:  None. FINDINGS: No definite acute fracture or dislocation is identified. There is mild cortical irregularity or spurring along the dorsal aspect of the anterior talus without a displaced fracture or significant overlying soft tissue swelling identified. IMPRESSION: 1. No definite acute osseous abnormality. 2. Mild cortical irregularity along the dorsal anterior talus, not favored to represent an acute avulsion fracture though correlate for focal tenderness in this location. Electronically Signed   By: Sebastian AcheAllen  Grady M.D.   On: 12/21/2018 11:25    Procedures Procedures (including critical care time)  Medications Ordered in ED Medications - No data to display   Initial Impression / Assessment and Plan / ED Course  I have reviewed the triage vital signs and the nursing notes.  Pertinent labs & imaging results that were available during my care of the patient were reviewed by me and considered in my medical decision making (see chart for details).     32 yo with traumatic acute left ankle pain. X-ray shows irregularity over dorsal talus where pt has point tenderness. He is otherwise NVI in this extremity.  Clinical picture not consistent with DVT, septic arthritis, gout.  He does not want to miss work. Given this, I will dc with cam walker, RICE protocol. F/u with podiatry in 7-10 days for re-evaluation and for formal med clearance. Return precautions given. Pt in agreement with this.   Final Clinical Impressions(s) / ED Diagnoses   Final diagnoses:  Closed nondisplaced avulsion fracture of left talus, initial encounter    ED Discharge Orders    None       Liberty HandyGibbons, Kiril Hippe J, PA-C 12/21/18 1727    Maia PlanLong, Joshua G, MD 12/21/18 1944

## 2019-01-11 ENCOUNTER — Other Ambulatory Visit: Payer: Self-pay

## 2019-01-11 ENCOUNTER — Emergency Department (HOSPITAL_BASED_OUTPATIENT_CLINIC_OR_DEPARTMENT_OTHER): Payer: Self-pay

## 2019-01-11 ENCOUNTER — Emergency Department (HOSPITAL_BASED_OUTPATIENT_CLINIC_OR_DEPARTMENT_OTHER)
Admission: EM | Admit: 2019-01-11 | Discharge: 2019-01-11 | Disposition: A | Discharge status: Home / Self Care | Payer: Self-pay | Attending: Emergency Medicine | Admitting: Emergency Medicine

## 2019-01-11 ENCOUNTER — Encounter (HOSPITAL_BASED_OUTPATIENT_CLINIC_OR_DEPARTMENT_OTHER): Payer: Self-pay | Admitting: Emergency Medicine

## 2019-01-11 DIAGNOSIS — M25531 Pain in right wrist: Secondary | ICD-10-CM | POA: Insufficient documentation

## 2019-01-11 DIAGNOSIS — F17210 Nicotine dependence, cigarettes, uncomplicated: Secondary | ICD-10-CM | POA: Insufficient documentation

## 2019-01-11 MED ORDER — ACETAMINOPHEN 325 MG PO TABS
650.0000 mg | ORAL_TABLET | Freq: Once | ORAL | Status: AC
  Administered 2019-01-11: 650 mg via ORAL
  Filled 2019-01-11: qty 2

## 2019-01-11 MED ORDER — IBUPROFEN 800 MG PO TABS
800.0000 mg | ORAL_TABLET | Freq: Once | ORAL | Status: AC
  Administered 2019-01-11: 800 mg via ORAL
  Filled 2019-01-11: qty 1
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
## 2019-01-11 NOTE — ED Provider Notes (Signed)
[facility] EMERGENCY DEPARTMENT Provider Note   CSN: [id number] Arrival date & time: 01/11/19  1740     History   Chief Complaint Chief Complaint  Patient presents with  . Wrist Pain    HPI Derrick Hurst is a 33 y.o. adult.  HPI   33 year old male presents with right wrist pain.  Patient states symptoms started after he got mad at his friend when he could not find his keys.  He then punched a door.  This happened at approximately 11 AM and he "tried to tough it out."  He stated however that his pain persisted so he came to the ER.  He notes feeling in pain to the medial dorsal right wrist.  He states pain with range of motion of the wrist.  He denies any numbness, tingling, difficulty moving his fingers.  He states only pain with moving his fingers.  He denies any other injuries or trauma.  No past medical history on file.  There are no active problems to display for this patient.   No past surgical history on file.      Home Medications    Prior to Admission medications   Medication Sig Start Date End Date Taking? Authorizing Provider  ibuprofen (ADVIL,MOTRIN) 600 MG tablet Take 1 tablet (600 mg total) by mouth every 8 (eight) hours as needed for moderate pain. 04/19/17   Zadie Rhine, MD  ondansetron (ZOFRAN ODT) 4 MG disintegrating tablet Take 1 tablet (4 mg total) by mouth every 8 (eight) hours as needed for nausea or vomiting. 03/26/18   Petrucelli, Pleas Koch, PA-C    Family History No family history on file.  Social History Social History   Tobacco Use  . Smoking status: Current Every Day Smoker    Types: Cigarettes  . Smokeless tobacco: Never Used  Substance Use Topics  . Alcohol use: Yes    Comment: occ  . Drug use: Yes    Types: Marijuana    Comment: everyday     Allergies   Patient has no known allergies.   Review of Systems Review of Systems  Constitutional: Negative for chills and fever.  Respiratory: Negative for  shortness of breath.   Cardiovascular: Negative for chest pain.  Gastrointestinal: Negative for abdominal pain, nausea and vomiting.  Musculoskeletal: Positive for arthralgias (right wrist pain).  Skin: Negative for rash and wound.     Physical Exam Updated Vital Signs BP 127/80 (BP Location: Left Arm)   Pulse 63   Temp 99.1 F (37.3 C) (Oral)   Resp 16   Ht 5\' 11"  (1.803 m)   Wt 87.1 kg   SpO2 97%   BMI 26.78 kg/m   Physical Exam Vitals signs and nursing note reviewed.  Constitutional:      Appearance: She is well-developed.  HENT:     Head: Normocephalic and atraumatic.  Eyes:     Conjunctiva/sclera: Conjunctivae normal.  Neck:     Musculoskeletal: Neck supple.  Cardiovascular:     Rate and Rhythm: Normal rate and regular rhythm.     Heart sounds: Normal heart sounds. No murmur.  Pulmonary:     Effort: Pulmonary effort is normal. No respiratory distress.     Breath sounds: Normal breath sounds. No wheezing or rales.  Abdominal:     General: Bowel sounds are normal. There is no distension.     Palpations: Abdomen is soft.     Tenderness: There is no abdominal tenderness.  Musculoskeletal: Normal range of  motion.        General: No deformity.     Right wrist: She exhibits tenderness (over the medial dorsal wrist). She exhibits no swelling, no crepitus, no deformity and no laceration.     Comments: Range of motion of right hand hand and wrist, normal capillary refill.  Palpable radial pulse.  Snuffbox tenderness.  Skin:    General: Skin is warm and dry.     Findings: No erythema or rash.  Neurological:     Mental Status: She is alert and oriented to person, place, and time.  Psychiatric:        Behavior: Behavior normal.      ED Treatments / Results  Labs (all labs ordered are listed, but only abnormal results are displayed) Labs Reviewed - No data to display  EKG None  Radiology Dg Wrist Complete Right  Result Date: 01/11/2019 CLINICAL DATA:  Pain  after punching door this evening. EXAM: RIGHT WRIST - COMPLETE 3+ VIEW COMPARISON:  None. FINDINGS: There is no evidence of acute fracture or dislocation. There is no evidence of arthropathy or other focal bone abnormality. Old fracture deformity of the distal right fifth metacarpal is suggested with healing. IMPRESSION: No acute fracture or dislocation of the right wrist. Electronically Signed   By: Tollie Ethavid  Kwon M.D.   On: 01/11/2019 18:29   Dg Hand Complete Right  Result Date: 01/11/2019 CLINICAL DATA:  Patient punched a door last evening and injured right hand and wrist. Dorsal pain with swelling. EXAM: RIGHT HAND - COMPLETE 3+ VIEW COMPARISON:  04/18/2017 FINDINGS: No acute fracture nor joint dislocation. Probable old fracture deformity with healing involving the distal right fifth metacarpal. Carpal rows are maintained. The distal radius and ulna appear intact. Slight dorsal soft tissue swelling over the MCP joints. IMPRESSION: No acute osseous abnormality of the right hand and wrist. Slight dorsal soft tissue swelling over the MCP articulations. Electronically Signed   By: Tollie Ethavid  Kwon M.D.   On: 01/11/2019 18:28    Procedures Procedures (including critical care time)  Medications Ordered in ED Medications  acetaminophen (TYLENOL) tablet 650 mg (has no administration in time range)  ibuprofen (ADVIL,MOTRIN) tablet 800 mg (800 mg Oral Given 01/11/19 1802)     Initial Impression / Assessment and Plan / ED Course  I have reviewed the triage vital signs and the nursing notes.  Pertinent labs & imaging results that were available during my care of the patient were reviewed by me and considered in my medical decision making (see chart for details).     Patient presented with right wrist pain after he punched a door.  His physical exam shows mild tenderness over the dorsal medial right wrist, no erythema/edema/ecchymosis.  His x-rays show no fractures.  Slight dorsal soft tissue swelling over MCP  was noted however patient is nontender there and has full range of motion of his fingers.  Will place patient in Velcro wrist splint.  Encouraged rice.  Given strict return precautions.  He is resting comfortably in bed, no acute distress, nontoxic, non-lethargic vital signs stable.  He is ready and stable for discharge.   At this time there does not appear to be any evidence of an acute emergency medical condition and the patient appears stable for discharge with appropriate outpatient follow up.Diagnosis was discussed with patient who verbalizes understanding and is agreeable to discharge.  Final Clinical Impressions(s) / ED Diagnoses   Final diagnoses:  None    ED Discharge Orders  None       Rueben Bash 01/26/19 1259    Terrilee Files, MD 01/27/19 [phone]

## 2019-01-11 NOTE — Discharge Instructions (Addendum)
Ice affected area.  Wear wrist denies as needed for comfort.  Take Tylenol or Motrin as needed for pain.  Follow-up with orthopedics in a week for persistent pain.  Return to the ED immediately for new or worsening symptoms, numbness, tingling, unable to move your fingers or any concerns at all.

## 2019-03-11 ENCOUNTER — Encounter (HOSPITAL_BASED_OUTPATIENT_CLINIC_OR_DEPARTMENT_OTHER): Payer: Self-pay | Admitting: *Deleted

## 2019-03-11 ENCOUNTER — Emergency Department (HOSPITAL_BASED_OUTPATIENT_CLINIC_OR_DEPARTMENT_OTHER)
Admission: EM | Admit: 2019-03-11 | Discharge: 2019-03-11 | Disposition: A | Discharge status: Home / Self Care | Payer: Self-pay | Attending: Emergency Medicine | Admitting: Emergency Medicine

## 2019-03-11 ENCOUNTER — Other Ambulatory Visit: Payer: Self-pay

## 2019-03-11 DIAGNOSIS — Y92019 Unspecified place in single-family (private) house as the place of occurrence of the external cause: Secondary | ICD-10-CM | POA: Insufficient documentation

## 2019-03-11 DIAGNOSIS — F17210 Nicotine dependence, cigarettes, uncomplicated: Secondary | ICD-10-CM | POA: Insufficient documentation

## 2019-03-11 DIAGNOSIS — F1210 Cannabis abuse, uncomplicated: Secondary | ICD-10-CM | POA: Insufficient documentation

## 2019-03-11 DIAGNOSIS — Y998 Other external cause status: Secondary | ICD-10-CM | POA: Insufficient documentation

## 2019-03-11 DIAGNOSIS — Y93E6 Activity, residential relocation: Secondary | ICD-10-CM | POA: Insufficient documentation

## 2019-03-11 DIAGNOSIS — X509XXA Other and unspecified overexertion or strenuous movements or postures, initial encounter: Secondary | ICD-10-CM | POA: Insufficient documentation

## 2019-03-11 DIAGNOSIS — S29012A Strain of muscle and tendon of back wall of thorax, initial encounter: Secondary | ICD-10-CM | POA: Insufficient documentation

## 2019-03-11 MED ORDER — NAPROXEN 500 MG PO TABS: 500.0000 mg | ORAL_TABLET | Freq: Two times a day (BID) | ORAL | 0 refills | Status: DC

## 2019-03-11 MED ORDER — CYCLOBENZAPRINE HCL 10 MG PO TABS: 10.0000 mg | ORAL_TABLET | Freq: Two times a day (BID) | ORAL | 0 refills | Status: DC | PRN

## 2019-03-11 NOTE — ED Triage Notes (Signed)
Upper back pain for a week. Started while moving into a new location.

## 2019-03-11 NOTE — ED Provider Notes (Signed)
[facility] EMERGENCY DEPARTMENT Provider Note   CSN: [id number] Arrival date & time: 03/11/19  2122    History   Chief Complaint No chief complaint on file.   HPI Derrick Hurst is a 33 y.o. adult.     Patient is a 33 year old male who presents with pain in his upper back.  He states he was moving some furniture into a new house last week and feels like he pulled some muscles in his upper back.  He has pain between his shoulder blades along the muscles.  He denies any radiation down his arms.  No numbness or weakness in his upper extremities.  No dizziness.  No chest pain or shortness of breath.  No history of similar symptoms in the past.  He has not been taking anything at home for the pain.     History reviewed. No pertinent past medical history.  There are no active problems to display for this patient.   History reviewed. No pertinent surgical history.      Home Medications    Prior to Admission medications   Medication Sig Start Date End Date Taking? Authorizing Provider  cyclobenzaprine (FLEXERIL) 10 MG tablet Take 1 tablet (10 mg total) by mouth 2 (two) times daily as needed for muscle spasms. 03/11/19   Rolan Bucco, MD  ibuprofen (ADVIL,MOTRIN) 600 MG tablet Take 1 tablet (600 mg total) by mouth every 8 (eight) hours as needed for moderate pain. 04/19/17   Zadie Rhine, MD  naproxen (NAPROSYN) 500 MG tablet Take 1 tablet (500 mg total) by mouth 2 (two) times daily. 03/11/19   Rolan Bucco, MD  ondansetron (ZOFRAN ODT) 4 MG disintegrating tablet Take 1 tablet (4 mg total) by mouth every 8 (eight) hours as needed for nausea or vomiting. 03/26/18   Petrucelli, Pleas Koch, PA-C    Family History No family history on file.  Social History Social History   Tobacco Use  . Smoking status: Current Every Day Smoker    Types: Cigarettes  . Smokeless tobacco: Never Used  Substance Use Topics  . Alcohol use: Yes    Comment: occ  . Drug use: Yes   Types: Marijuana    Comment: everyday     Allergies   Patient has no known allergies.   Review of Systems Review of Systems  Constitutional: Negative for fever.  Gastrointestinal: Negative for nausea and vomiting.  Musculoskeletal: Positive for back pain. Negative for arthralgias, joint swelling and neck pain.  Skin: Negative for wound.  Neurological: Negative for weakness, numbness and headaches.     Physical Exam Updated Vital Signs BP (!) 142/71   Pulse 81   Temp 98.1 F (36.7 C) (Oral)   Resp 18   Ht 5\' 11"  (1.803 m)   Wt 86.2 kg   SpO2 99%   BMI 26.50 kg/m   Physical Exam Constitutional:      Appearance: She is well-developed.  HENT:     Head: Normocephalic and atraumatic.  Neck:     Musculoskeletal: Normal range of motion and neck supple.  Cardiovascular:     Rate and Rhythm: Normal rate.     Heart sounds: Normal heart sounds. No murmur.  Pulmonary:     Effort: Pulmonary effort is normal.     Breath sounds: Normal breath sounds.  Musculoskeletal:        General: Tenderness present.     Comments: Patient has tenderness along the paraspinal muscles in the upper back.  There is no  spinal tenderness.  No bony tenderness to the scapula.  No cervical tenderness.  He has normal sensation and motor function in the upper extremities.  Radial pulses are intact.  Skin:    General: Skin is warm and dry.  Neurological:     Mental Status: She is alert and oriented to person, place, and time.      ED Treatments / Results  Labs (all labs ordered are listed, but only abnormal results are displayed) Labs Reviewed - No data to display  EKG None  Radiology No results found.  Procedures Procedures (including critical care time)  Medications Ordered in ED Medications - No data to display   Initial Impression / Assessment and Plan / ED Course  I have reviewed the triage vital signs and the nursing notes.  Pertinent labs & imaging results that were available  during my care of the patient were reviewed by me and considered in my medical decision making (see chart for details).        Patient presents with upper back pain.  It seems to be muscular nature.  There is no bony tenderness.  No neurologic deficits.  He was discharged home in good condition.  He was given a prescription for Naprosyn and Flexeril.  Return precautions were given.  Final Clinical Impressions(s) / ED Diagnoses   Final diagnoses:  Upper back strain, initial encounter    ED Discharge Orders         Ordered    naproxen (NAPROSYN) 500 MG tablet  2 times daily     03/11/19 2201    cyclobenzaprine (FLEXERIL) 10 MG tablet  2 times daily PRN     03/11/19 2201           Rolan Bucco, MD 03/11/19 2203

## 2019-03-23 ENCOUNTER — Encounter (HOSPITAL_BASED_OUTPATIENT_CLINIC_OR_DEPARTMENT_OTHER): Payer: Self-pay | Admitting: Emergency Medicine

## 2019-03-23 ENCOUNTER — Emergency Department (HOSPITAL_BASED_OUTPATIENT_CLINIC_OR_DEPARTMENT_OTHER)
Admission: EM | Admit: 2019-03-23 | Discharge: 2019-03-24 | Disposition: A | Discharge status: Home / Self Care | Payer: Self-pay | Attending: Emergency Medicine | Admitting: Emergency Medicine

## 2019-03-23 ENCOUNTER — Other Ambulatory Visit: Payer: Self-pay

## 2019-03-23 DIAGNOSIS — F17210 Nicotine dependence, cigarettes, uncomplicated: Secondary | ICD-10-CM | POA: Insufficient documentation

## 2019-03-23 DIAGNOSIS — M549 Dorsalgia, unspecified: Secondary | ICD-10-CM | POA: Insufficient documentation

## 2019-03-23 NOTE — ED Triage Notes (Addendum)
Pt c/o pain in upper back. Pt seen here for same last week. 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

## 2019-03-24 ENCOUNTER — Emergency Department (HOSPITAL_BASED_OUTPATIENT_CLINIC_OR_DEPARTMENT_OTHER): Payer: Self-pay

## 2019-03-24 MED ORDER — ORPHENADRINE CITRATE ER 100 MG PO TB12: 100.0000 mg | ORAL_TABLET | Freq: Two times a day (BID) | ORAL | 0 refills | Status: DC

## 2019-03-24 MED ORDER — NAPROXEN 500 MG PO TABS: 500.0000 mg | ORAL_TABLET | Freq: Two times a day (BID) | ORAL | 0 refills | Status: DC

## 2019-03-24 MED ORDER — TRAMADOL HCL 50 MG PO TABS: 50.0000 mg | ORAL_TABLET | Freq: Four times a day (QID) | ORAL | 0 refills | Status: DC | PRN

## 2019-03-24 NOTE — Discharge Instructions (Signed)
Try applying ice or heat.  You may take acetaminophen as needed for additional pain relief.

## 2019-03-24 NOTE — ED Notes (Signed)
Pt teaching provided on medications that may cause drowsiness. Pt instructed not to drive or operate heavy machinery while taking the prescribed medication. Pt verbalized understanding.   

## 2019-03-24 NOTE — ED Notes (Signed)
ED Provider at bedside. 

## 2019-03-24 NOTE — ED Provider Notes (Signed)
[facility] EMERGENCY DEPARTMENT Provider Note   CSN: [id number] Arrival date & time: 03/23/19  2333    History   Chief Complaint Chief Complaint  Patient presents with  . Back Pain    HPI Derrick Hurst is a 33 y.o. adult.  The history is provided by the patient.  He has been having problems with upper back pain over approximately last 10days.  Pain started when he was moving things into his new home.  He was seen in the emergency department and given prescriptions for naproxen and cyclobenzaprine.  He has been taking them at bedtime.  He continues to complain of pain in his upper back.  This is worse when he leans forward and worse when he tries to push things.  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  There is no radiation of pain.  He denies weakness, numbness, tingling.  He rates pain at 10/10.  History reviewed. No pertinent past medical history.  There are no active problems to display for this patient.   History reviewed. No pertinent surgical history.      Home Medications    Prior to Admission medications   Medication Sig Start Date End Date Taking? Authorizing Provider  cyclobenzaprine (FLEXERIL) 10 MG tablet Take 1 tablet (10 mg total) by mouth 2 (two) times daily as needed for muscle spasms. 03/11/19   Rolan Bucco, MD  ibuprofen (ADVIL,MOTRIN) 600 MG tablet Take 1 tablet (600 mg total) by mouth every 8 (eight) hours as needed for moderate pain. 04/19/17   Zadie Rhine, MD  naproxen (NAPROSYN) 500 MG tablet Take 1 tablet (500 mg total) by mouth 2 (two) times daily. 03/11/19   Rolan Bucco, MD  ondansetron (ZOFRAN ODT) 4 MG disintegrating tablet Take 1 tablet (4 mg total) by mouth every 8 (eight) hours as needed for nausea or vomiting. 03/26/18   Petrucelli, Pleas Koch, PA-C    Family History No family history on file.  Social History Social History   Tobacco Use  . Smoking status: Current  Every Day Smoker    Types: Cigarettes  . Smokeless tobacco: Never Used  Substance Use Topics  . Alcohol use: Yes    Comment: occ  . Drug use: Yes    Types: Marijuana    Comment: everyday     Allergies   Patient has no known allergies.   Review of Systems Review of Systems  All other systems reviewed and are negative.    Physical Exam Updated Vital Signs BP 125/67 (BP Location: Right Arm)   Pulse 70   Temp 98.3 F (36.8 C) (Oral)   Resp 18   Ht 5\' 11"  (1.803 m)   Wt 86.1 kg   SpO2 99%   BMI 26.47 kg/m   Physical Exam Vitals signs and nursing note reviewed.    33 year old male, resting comfortably and in no acute distress.  However, when he tries to sit up, he is clearly a lot of pain.  Vital signs are normal. Oxygen saturation is 99%, which is normal. Head is normocephalic and atraumatic. PERRLA, EOMI. Oropharynx is clear. Neck is nontender and supple without adenopathy or JVD. Back is nontender and there is no CVA tenderness.  There is no palpable muscle spasm. Lungs are clear without rales, wheezes, or rhonchi. Chest is nontender. Heart has regular rate and rhythm without murmur. Abdomen is soft, flat, nontender without masses or hepatosplenomegaly and peristalsis is normoactive. Extremities  have no cyanosis or edema, full range of motion is present.  All pulses are 2+. Skin is warm and dry without rash. Neurologic: Mental status is normal, cranial nerves are intact, there are no motor or sensory deficits.  ED Treatments / Results   Radiology Dg Chest 2 View  Result Date: 03/24/2019 CLINICAL DATA:  Upper back pain for 2 weeks. Some shortness of breath. EXAM: CHEST - 2 VIEW COMPARISON:  None. FINDINGS: The heart size and mediastinal contours are within normal limits. Both lungs are clear. The visualized skeletal structures are unremarkable. IMPRESSION: No active cardiopulmonary disease. Electronically Signed   By: Signa Kell M.D.   On: 03/24/2019 00:31     Procedures Procedures   Medications Ordered in ED Medications - No data to display   Initial Impression / Assessment and Plan / ED Course  I have reviewed the triage vital signs and the nursing notes.  Pertinent imaging results that were available during my care of the patient were reviewed by me and considered in my medical decision making (see chart for details).  Upper back pain, probably musculoskeletal.  However it is somewhat curious that there is no tenderness on palpation and no palpable muscle spasm.  Will send for x-ray.  Old records reviewed confirming ED visit on March 19 with prescriptions for cyclobenzaprine and naproxen.  Chest x-ray is unremarkable including views of thoracic spine.  Will request that he increase his naproxen to twice a day, switch from cyclobenzaprine to orphenadrine and take that twice a day.  Given prescription for tramadol for.  Pain relief.  He is referred to sports medicine for follow-up.  Given work release to be off for 2 days, light duty for the following 7 days.  Final Clinical Impressions(s) / ED Diagnoses   Final diagnoses:  Upper back pain    ED Discharge Orders         Ordered    naproxen (NAPROSYN) 500 MG tablet  2 times daily     03/24/19 0044    orphenadrine (NORFLEX) 100 MG tablet  2 times daily     03/24/19 0044    traMADol (ULTRAM) 50 MG tablet  Every 6 hours PRN     03/24/19 0044           Dione Booze, MD 03/24/19 0050

## 2020-03-10 ENCOUNTER — Emergency Department (HOSPITAL_BASED_OUTPATIENT_CLINIC_OR_DEPARTMENT_OTHER): Payer: Self-pay

## 2020-03-10 ENCOUNTER — Emergency Department (HOSPITAL_BASED_OUTPATIENT_CLINIC_OR_DEPARTMENT_OTHER)
Admission: EM | Admit: 2020-03-10 | Discharge: 2020-03-10 | Disposition: A | Discharge status: Home / Self Care | Payer: Self-pay | Attending: Emergency Medicine | Admitting: Emergency Medicine

## 2020-03-10 ENCOUNTER — Other Ambulatory Visit: Payer: Self-pay

## 2020-03-10 ENCOUNTER — Encounter (HOSPITAL_BASED_OUTPATIENT_CLINIC_OR_DEPARTMENT_OTHER): Payer: Self-pay | Admitting: Emergency Medicine

## 2020-03-10 DIAGNOSIS — Z79899 Other long term (current) drug therapy: Secondary | ICD-10-CM | POA: Insufficient documentation

## 2020-03-10 DIAGNOSIS — Y9389 Activity, other specified: Secondary | ICD-10-CM | POA: Insufficient documentation

## 2020-03-10 DIAGNOSIS — Z791 Long term (current) use of non-steroidal anti-inflammatories (NSAID): Secondary | ICD-10-CM | POA: Insufficient documentation

## 2020-03-10 DIAGNOSIS — F17210 Nicotine dependence, cigarettes, uncomplicated: Secondary | ICD-10-CM | POA: Insufficient documentation

## 2020-03-10 DIAGNOSIS — W208XXA Other cause of strike by thrown, projected or falling object, initial encounter: Secondary | ICD-10-CM | POA: Insufficient documentation

## 2020-03-10 DIAGNOSIS — Y999 Unspecified external cause status: Secondary | ICD-10-CM | POA: Insufficient documentation

## 2020-03-10 DIAGNOSIS — S6991XA Unspecified injury of right wrist, hand and finger(s), initial encounter: Secondary | ICD-10-CM | POA: Insufficient documentation

## 2020-03-10 DIAGNOSIS — Y92009 Unspecified place in unspecified non-institutional (private) residence as the place of occurrence of the external cause: Secondary | ICD-10-CM | POA: Insufficient documentation

## 2020-03-10 MED ORDER — IBUPROFEN 800 MG PO TABS: 800.0000 mg | ORAL_TABLET | Freq: Three times a day (TID) | ORAL | 0 refills | Status: DC | PRN

## 2020-03-10 NOTE — ED Triage Notes (Signed)
Reports dropping a heavy speaker on right hand/wrist earlier today.  Now having pain and swelling.  Has not taken anything.

## 2020-03-10 NOTE — Discharge Instructions (Signed)
Take your ibuprofen, as prescribed.  Be sure to elevate your hand and apply ice to the affected area periodically.  Please read the attachment on crush injuries.  Please call Dr. Melvyn Novas to schedule appointment for ongoing evaluation and management if your symptoms fail to improve with conservative therapy.  Please return to the ED or seek immediate medical attention if you experience any new or worsening symptoms.

## 2020-03-10 NOTE — ED Provider Notes (Signed)
[facility] EMERGENCY DEPARTMENT Provider Note   CSN: [id number] Arrival date & time: 03/10/20  1934     History Chief Complaint  Patient presents with  . Hand Injury    Derrick Hurst is a 34 y.o. adult with no significant PMH been to the ED with right wrist discomfort.  Patient reports that he was trying to repair a wall speaker that was on his wall when it fell onto the volar aspect of his right wrist.  He is complaining of 7 out of 10 discomfort over the wrist in the area of the pisiform.  There is some swelling in that area, but he reports that it is improved in the several hours since it first occurred.  He denies any numbness or weakness, but states that his range of motion is limited due to discomfort.  He denies any finger injury, wound, forearm or elbow discomfort, numbness, or other symptoms.  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  HPI     History reviewed. No pertinent past medical history.  There are no problems to display for this patient.   History reviewed. No pertinent surgical history.     No family history on file.  Social History   Tobacco Use  . Smoking status: Current Every Day Smoker    Types: Cigarettes  . Smokeless tobacco: Never Used  Substance Use Topics  . Alcohol use: Yes    Comment: occ  . Drug use: Yes    Types: Marijuana    Comment: everyday    Home Medications Prior to Admission medications   Medication Sig Start Date End Date Taking? Authorizing Provider  ibuprofen (ADVIL) 800 MG tablet Take 1 tablet (800 mg total) by mouth 3 (three) times daily with meals as needed for moderate pain. 03/10/20   Lorelee New, PA-C  naproxen (NAPROSYN) 500 MG tablet Take 1 tablet (500 mg total) by mouth 2 (two) times daily. 03/24/19   Dione Booze, MD  ondansetron (ZOFRAN ODT) 4 MG disintegrating tablet Take 1 tablet (4 mg total) by mouth every 8 (eight) hours as needed for nausea or vomiting. 03/26/18   Petrucelli,  Samantha R, PA-C  orphenadrine (NORFLEX) 100 MG tablet Take 1 tablet (100 mg total) by mouth 2 (two) times daily. 03/24/19   Dione Booze, MD  traMADol (ULTRAM) 50 MG tablet Take 1 tablet (50 mg total) by mouth every 6 (six) hours as needed. 03/24/19   Dione Booze, MD    Allergies    Patient has no known allergies.  Review of Systems   Review of Systems  Musculoskeletal: Positive for arthralgias.  Skin: Negative for color change and wound.  Neurological: Negative for weakness and numbness.    Physical Exam Updated Vital Signs BP 135/81 (BP Location: Right Arm)   Pulse 61   Temp 98 F (36.7 C) (Oral)   Resp 20   Ht 5\' 11"  (1.803 m)   Wt 83.9 kg   SpO2 100%   BMI 25.80 kg/m   Physical Exam Vitals and nursing note reviewed. Exam conducted with a chaperone present.  Constitutional:      General: She is not in acute distress.    Appearance: Normal appearance.  HENT:     Head: Normocephalic and atraumatic.  Eyes:     General: No scleral icterus.    Conjunctiva/sclera: Conjunctivae normal.  Pulmonary:     Effort: Pulmonary effort is normal.  Musculoskeletal:     Comments: Right wrist:  TTP over ulnar aspect of wrist, most notably over pisiform bone.  Mild bump appreciated, but no overlying ecchymoses or other skin changes.  Assessed radial, median, and ulnar nerve which are all intact.  Cap refill intact.  Patient able to wiggle fingers.  Flexion and extension of wrist intact, albeit limited due to discomfort.  Compartments soft.  Skin:    General: Skin is dry.  Neurological:     Mental Status: She is alert.     GCS: GCS eye subscore is 4. GCS verbal subscore is 5. GCS motor subscore is 6.  Psychiatric:        Mood and Affect: Mood normal.        Behavior: Behavior normal.        Thought Content: Thought content normal.     ED Results / Procedures / Treatments   Labs (all labs ordered are listed, but only abnormal results are displayed) Labs Reviewed - No data to  display  EKG None  Radiology DG Wrist Complete Right  Result Date: 03/10/2020 CLINICAL DATA:  Injury, swelling.  Right wrist and hand pain. EXAM: RIGHT WRIST - COMPLETE 3+ VIEW COMPARISON:  None. FINDINGS: There is no evidence of fracture or dislocation. There is no evidence of arthropathy or other focal bone abnormality. Soft tissues are unremarkable. IMPRESSION: Negative. Electronically Signed   By: Rolm Baptise M.D.   On: 03/10/2020 20:11    Procedures Procedures (including critical care time)  Medications Ordered in ED Medications - No data to display  ED Course  I have reviewed the triage vital signs and the nursing notes.  Pertinent labs & imaging results that were available during my care of the patient were reviewed by me and considered in my medical decision making (see chart for details).    MDM Rules/Calculators/A&P                      I would reviewed patient's plain films obtained of right wrist which demonstrated no fracture, dislocation, or other bony abnormalities.  Offered patient ibuprofen here in the ED, but he declined.  However, he would appreciate NSAID prescription.  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  Will provide a work note x2 days and recommend that patient follow-up with hand surgeon should his symptoms would improve.  Assessed radial, median, nor nerve which are all intact.  Compartments are soft.  ROM intact, albeit limited due to to inflammation and discomfort.  Instructed to elevate the hand and provide ice periodically.  NSAIDs prescribed.  Strict ED return precautions discussed.  All of the evaluation and work-up results were discussed with the patient and any family at bedside. They were provided opportunity to ask any additional questions and have none at this time. They have expressed understanding of verbal discharge instructions as well as return  precautions and are agreeable to the plan.    Final Clinical Impression(s) / ED Diagnoses Final diagnoses:  Injury of right wrist, initial encounter    Rx / DC Orders ED Discharge Orders         Ordered    ibuprofen (ADVIL) 800 MG tablet  3 times daily with meals PRN     03/10/20 2304           Corena Herter, PA-C 03/10/20 2305    Dorie Rank, MD 03/11/20 1218

## 2020-12-12 ENCOUNTER — Encounter (HOSPITAL_BASED_OUTPATIENT_CLINIC_OR_DEPARTMENT_OTHER): Payer: Self-pay

## 2020-12-12 ENCOUNTER — Other Ambulatory Visit: Payer: Self-pay

## 2020-12-12 ENCOUNTER — Emergency Department (HOSPITAL_BASED_OUTPATIENT_CLINIC_OR_DEPARTMENT_OTHER)
Admission: EM | Admit: 2020-12-12 | Discharge: 2020-12-13 | Disposition: A | Discharge status: Home / Self Care | Payer: Self-pay | Attending: Emergency Medicine | Admitting: Emergency Medicine

## 2020-12-12 DIAGNOSIS — K047 Periapical abscess without sinus: Secondary | ICD-10-CM

## 2020-12-12 DIAGNOSIS — F17210 Nicotine dependence, cigarettes, uncomplicated: Secondary | ICD-10-CM | POA: Insufficient documentation

## 2020-12-12 DIAGNOSIS — K029 Dental caries, unspecified: Secondary | ICD-10-CM | POA: Insufficient documentation

## 2020-12-12 DIAGNOSIS — R112 Nausea with vomiting, unspecified: Secondary | ICD-10-CM

## 2020-12-12 MED ORDER — PENICILLIN V POTASSIUM 250 MG PO TABS
500.0000 mg | ORAL_TABLET | Freq: Once | ORAL | Status: AC
  Administered 2020-12-12: 500 mg via ORAL
  Filled 2020-12-12: qty 2

## 2020-12-12 MED ORDER — IBUPROFEN 800 MG PO TABS
800.0000 mg | ORAL_TABLET | Freq: Once | ORAL | Status: AC
  Administered 2020-12-12: 800 mg via ORAL
  Filled 2020-12-12: qty 1

## 2020-12-12 MED ORDER — ONDANSETRON 4 MG PO TBDP
4.0000 mg | ORAL_TABLET | Freq: Once | ORAL | Status: AC
  Administered 2020-12-12: 4 mg via ORAL
  Filled 2020-12-12: qty 1

## 2020-12-12 NOTE — ED Triage Notes (Signed)
Pt c/o left dental abscess-facial swelling started yesterday-NAD-steady gait

## 2020-12-12 NOTE — ED Provider Notes (Signed)
TIME SEEN: 11:36 PM  CHIEF COMPLAINT: Dental abscess  HPI: Patient is a 34 year old male who presents to the emergency department with concerns for dental abscess for 2 days.  States that he has had facial swelling and pain.  States he started having nausea and vomiting tonight.  No abdominal pain.  No diarrhea.  No fevers.  Does not have a dentist.  Took ibuprofen at 5 PM.  States that the swelling has improved.  ROS: See HPI Constitutional: no fever  Eyes: no drainage  ENT: no runny nose   Cardiovascular:  no chest pain  Resp: no SOB  GI:  vomiting GU: no dysuria Integumentary: no rash  Allergy: no hives  Musculoskeletal: no leg swelling  Neurological: no slurred speech ROS otherwise negative  PAST MEDICAL HISTORY/PAST SURGICAL HISTORY:  History reviewed. No pertinent past medical history.  MEDICATIONS:  Prior to Admission medications   Medication Sig Start Date End Date Taking? Authorizing Provider  ibuprofen (ADVIL) 800 MG tablet Take 1 tablet (800 mg total) by mouth 3 (three) times daily with meals as needed for moderate pain. 03/10/20   Lorelee New, PA-C  naproxen (NAPROSYN) 500 MG tablet Take 1 tablet (500 mg total) by mouth 2 (two) times daily. 03/24/19   Dione Booze, MD  ondansetron (ZOFRAN ODT) 4 MG disintegrating tablet Take 1 tablet (4 mg total) by mouth every 8 (eight) hours as needed for nausea or vomiting. 03/26/18   Petrucelli, Samantha R, PA-C  orphenadrine (NORFLEX) 100 MG tablet Take 1 tablet (100 mg total) by mouth 2 (two) times daily. 03/24/19   Dione Booze, MD  traMADol (ULTRAM) 50 MG tablet Take 1 tablet (50 mg total) by mouth every 6 (six) hours as needed. 03/24/19   Dione Booze, MD    ALLERGIES:  No Known Allergies  SOCIAL HISTORY:  Social History   Tobacco Use  . Smoking status: Current Every Day Smoker    Types: Cigarettes  . Smokeless tobacco: Never Used  Substance Use Topics  . Alcohol use: Yes    Comment: occ    FAMILY HISTORY: No family  history on file.  EXAM: BP (!) 142/81 (BP Location: Left Arm)   Pulse 75   Temp (!) 97.3 F (36.3 C) (Tympanic) Comment: vomiting in triage  Resp 20   Ht 5\' 11"  (1.803 m)   Wt 81.6 kg   BMI 25.10 kg/m  CONSTITUTIONAL: Alert and oriented and responds appropriately to questions. Well-appearing; well-nourished HEAD: Normocephalic EYES: Conjunctivae clear, pupils appear equal, EOM appear intact ENT: normal nose; moist mucous membranes; No pharyngeal erythema or petechiae, no tonsillar hypertrophy or exudate, no uvular deviation, no unilateral swelling, no trismus or drooling, no muffled voice, normal phonation, no stridor, patient has significant decay of the left lower posterior molar with minimal swelling around the gumline, no drainable dental abscess noted, no Ludwig's angina, tongue sits flat in the bottom of the mouth, no angioedema, no facial erythema or warmth, mild amount of facial swelling around the left mandible; no pain with movement of the neck, cervical lymphadenopathy noted on the left anterior neck NECK: Supple, normal ROM CARD: RRR; S1 and S2 appreciated; no murmurs, no clicks, no rubs, no gallops RESP: Normal chest excursion without splinting or tachypnea; breath sounds clear and equal bilaterally; no wheezes, no rhonchi, no rales, no hypoxia or respiratory distress, speaking full sentences ABD/GI: Normal bowel sounds; non-distended; soft, non-tender, no rebound, no guarding, no peritoneal signs, no hepatosplenomegaly BACK:  The back appears normal EXT:  Normal ROM in all joints; no deformity noted, no edema; no cyanosis SKIN: Normal color for age and race; warm; no rash on exposed skin NEURO: Moves all extremities equally PSYCH: The patient's mood and manner are appropriate.   MEDICAL DECISION MAKING: Patient here with dental caries and likely dental abscess.  No sign of drainable abscess here in the ED.  No sign of facial cellulitis.  No Ludwig's angina.  Will give  penicillin, ibuprofen and Zofran.  His abdominal exam is benign.  He is otherwise well-appearing here.  Denies discharge home with outpatient dental follow-up if tolerating p.o. and feeling better.  ED PROGRESS: Patient reports feeling much better.  Able to tolerate p.o.  Will discharge home with prescriptions for Zofran, ibuprofen, penicillin outpatient dental follow-up.  He is comfortable with this plan.  Discussed return precautions.  At this time, I do not feel there is any life-threatening condition present. I have reviewed, interpreted and discussed all results (EKG, imaging, lab, urine as appropriate) and exam findings with patient/family. I have reviewed nursing notes and appropriate previous records.  I feel the patient is safe to be discharged home without further emergent workup and can continue workup as an outpatient as needed. Discussed usual and customary return precautions. Patient/family verbalize understanding and are comfortable with this plan.  Outpatient follow-up has been provided as needed. All questions have been answered.      OTTAVIO NOREM was evaluated in Emergency Department on 12/12/2020 for the symptoms described in the history of present illness. She was evaluated in the context of the global COVID-19 pandemic, which necessitated consideration that the patient might be at risk for infection with the SARS-CoV-2 virus that causes COVID-19. Institutional protocols and algorithms that pertain to the evaluation of patients at risk for COVID-19 are in a state of rapid change based on information released by regulatory bodies including the CDC and federal and state organizations. These policies and algorithms were followed during the patient's care in the ED.      Mauriana Dann, Layla Maw, DO 12/13/20 [phone]

## 2020-12-13 MED ORDER — PENICILLIN V POTASSIUM 500 MG PO TABS: 500.0000 mg | ORAL_TABLET | Freq: Four times a day (QID) | ORAL | 0 refills | Status: DC

## 2020-12-13 MED ORDER — IBUPROFEN 800 MG PO TABS: 800.0000 mg | ORAL_TABLET | Freq: Three times a day (TID) | ORAL | 0 refills | Status: DC

## 2020-12-13 MED ORDER — ONDANSETRON 4 MG PO TBDP: 4.0000 mg | ORAL_TABLET | Freq: Four times a day (QID) | ORAL | 0 refills | Status: DC | PRN

## 2020-12-13 NOTE — Discharge Instructions (Addendum)
You may alternate Tylenol 1000 mg every 6 hours as needed for pain, fever and Ibuprofen 800 mg every 8 hours as needed for pain, fever.  Please take Ibuprofen with food.  Do not take more than 4000 mg of Tylenol (acetaminophen) in a 24 hour period.  

## 2020-12-13 NOTE — ED Notes (Signed)
Pt able to drink without nausea

## 2021-07-26 IMAGING — CR DG WRIST COMPLETE 3+V*R*
4 series · 4 of 4 positions shown · non-contrast
Comparison: None.

CLINICAL DATA: Injury, swelling.  Right wrist and hand pain.

EXAM:
RIGHT WRIST - COMPLETE 3+ VIEW

[x wrist pa right]
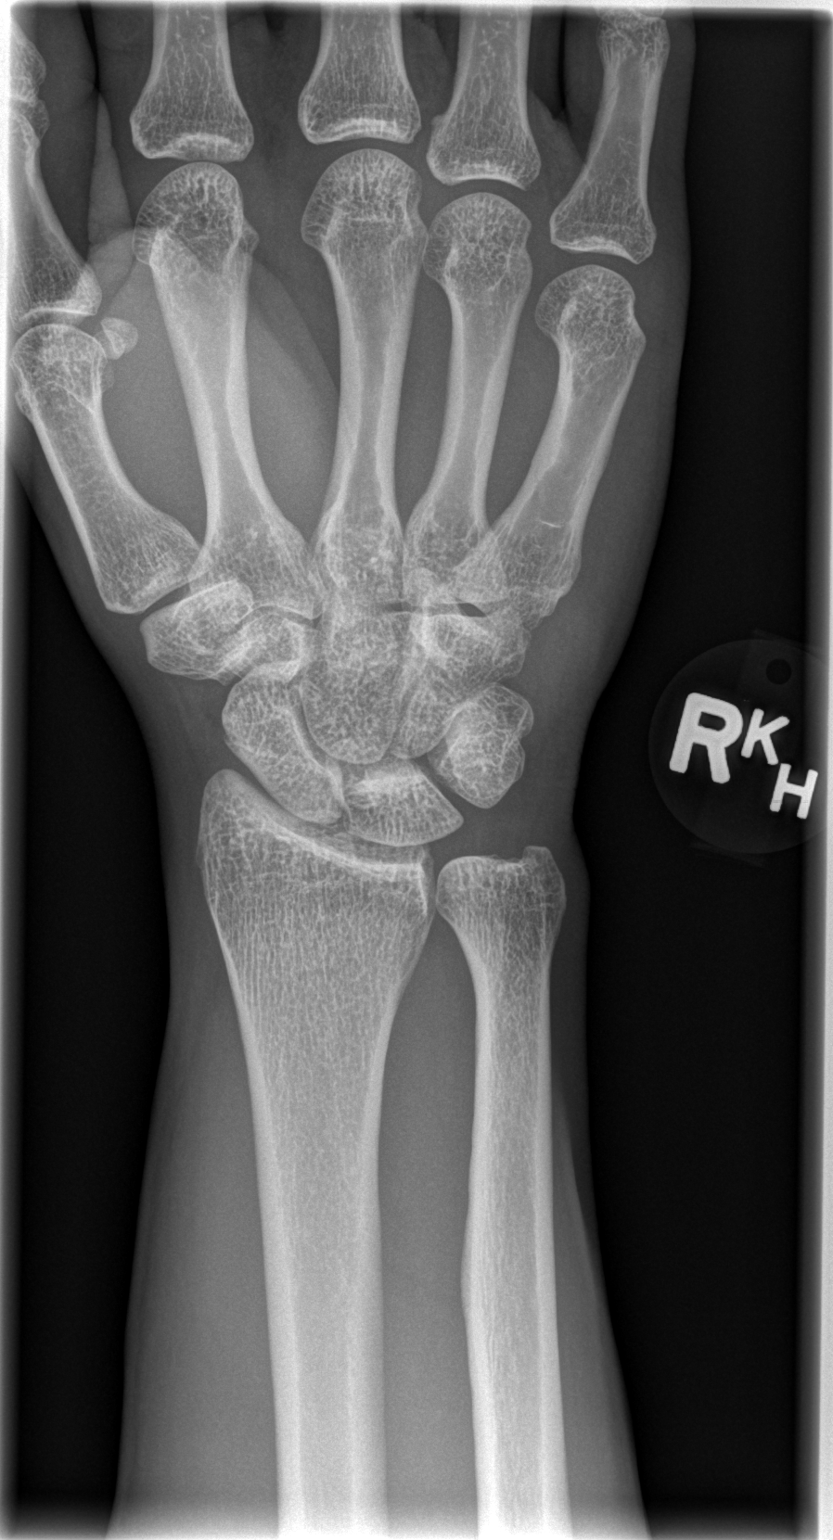

[x wrist obl right]
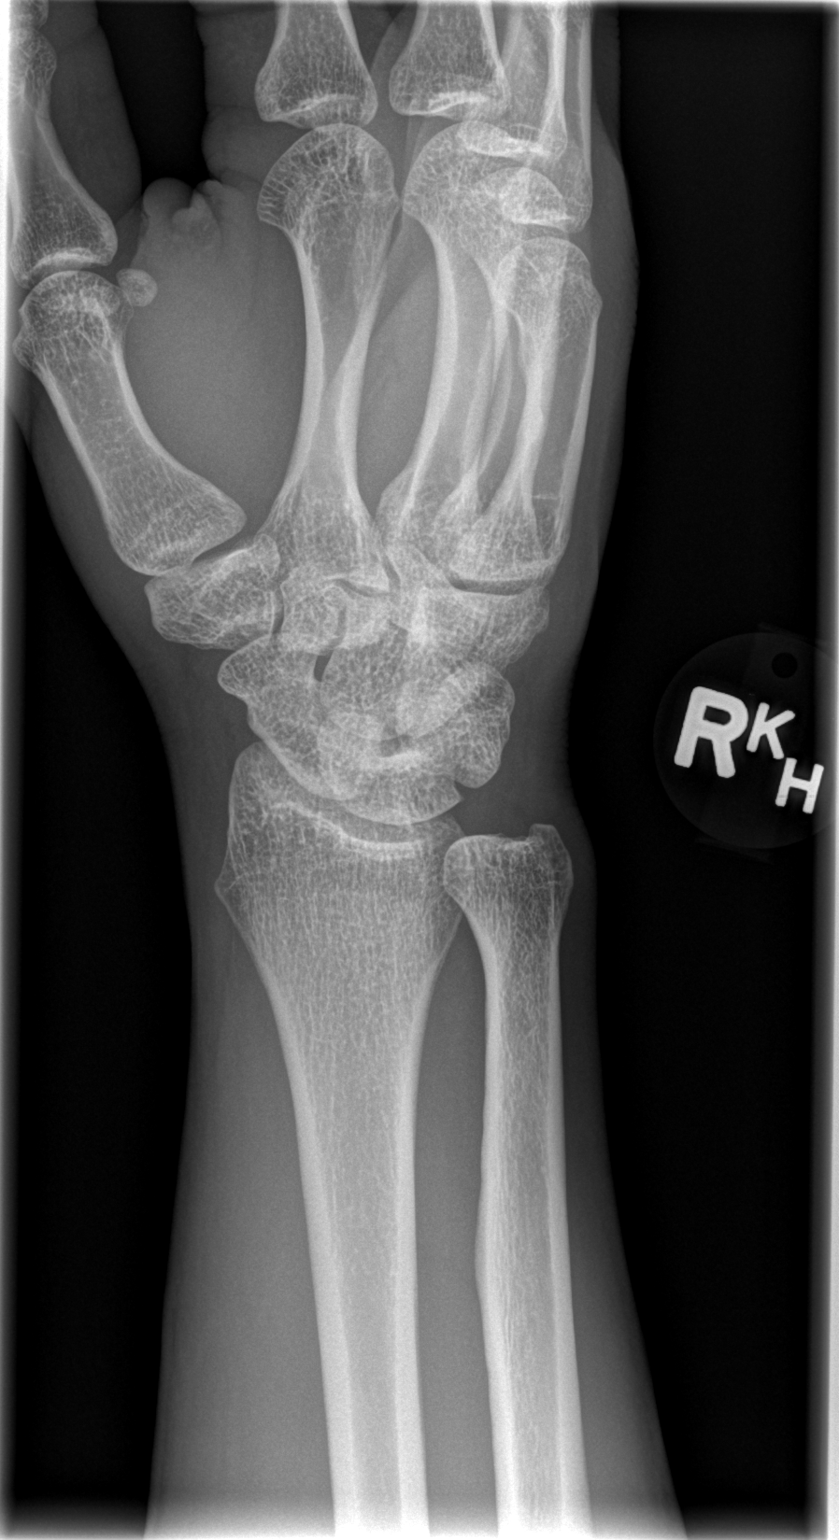

[x wrist lat right]
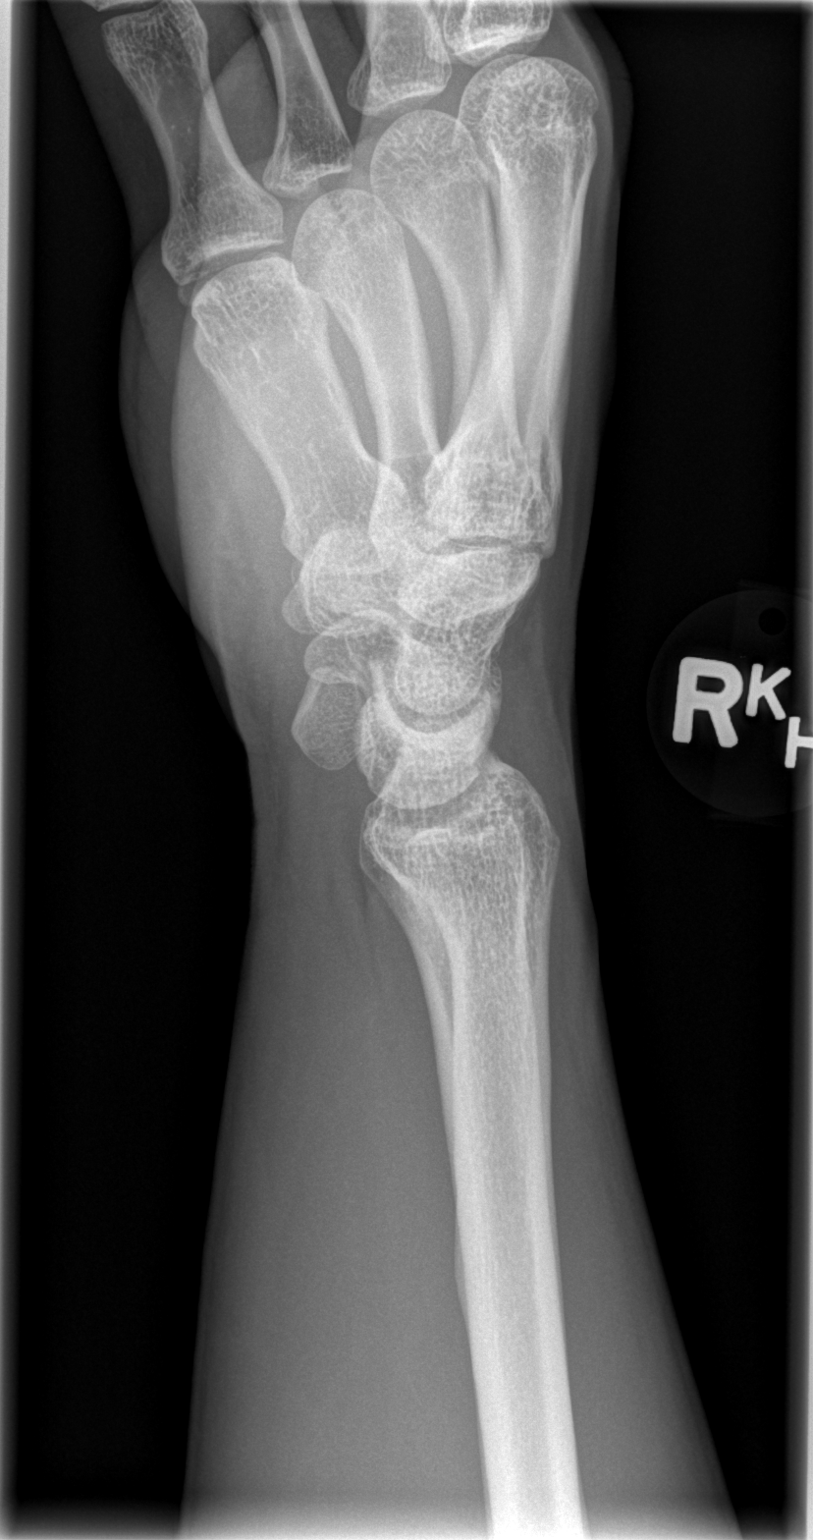

[x navicular]
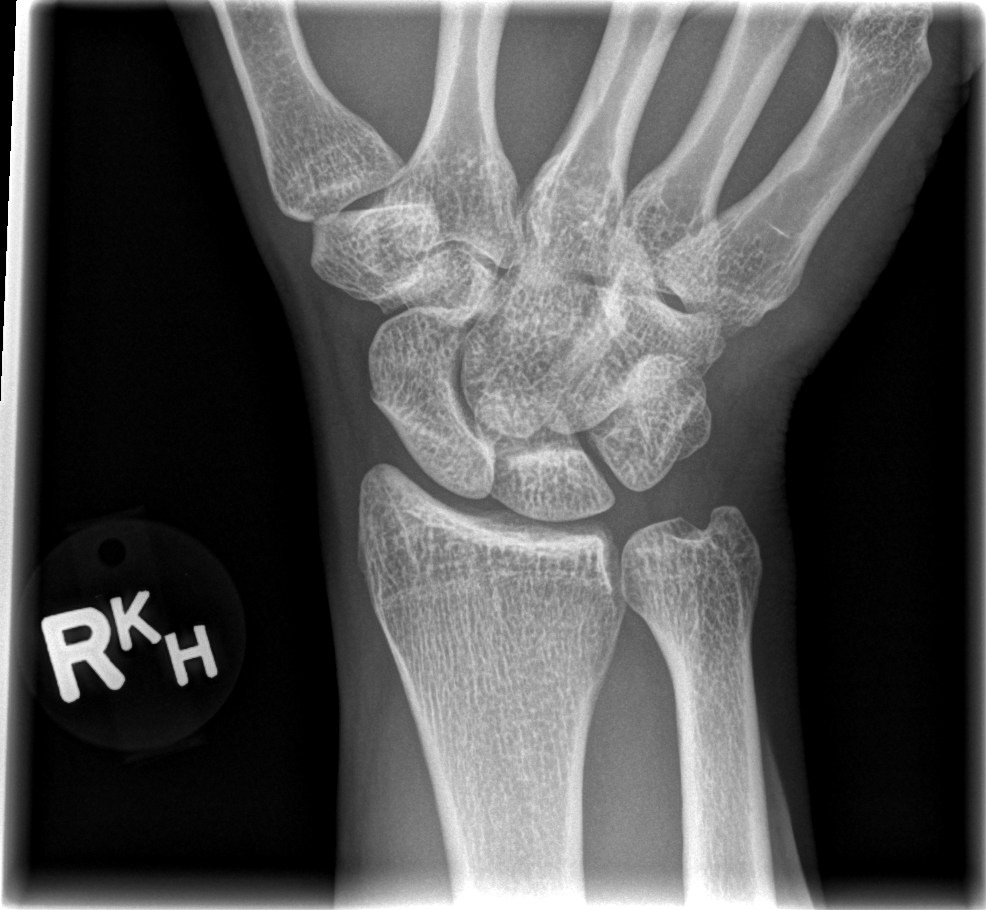

[4 of 4 positions shown; findings below may reference images not displayed]

FINDINGS: There is no evidence of fracture or dislocation. There is no
evidence of arthropathy or other focal bone abnormality. Soft
tissues are unremarkable.
IMPRESSION: Negative.

## 2021-07-28 ENCOUNTER — Encounter (HOSPITAL_COMMUNITY): Payer: Self-pay | Admitting: Emergency Medicine

## 2021-07-28 ENCOUNTER — Emergency Department (HOSPITAL_COMMUNITY)
Admission: EM | Admit: 2021-07-28 | Discharge: 2021-07-28 | Disposition: A | Discharge status: Home / Self Care | Payer: Self-pay | Attending: Emergency Medicine | Admitting: Emergency Medicine

## 2021-07-28 DIAGNOSIS — R590 Localized enlarged lymph nodes: Secondary | ICD-10-CM | POA: Insufficient documentation

## 2021-07-28 DIAGNOSIS — F17210 Nicotine dependence, cigarettes, uncomplicated: Secondary | ICD-10-CM | POA: Insufficient documentation

## 2021-07-28 DIAGNOSIS — N485 Ulcer of penis: Secondary | ICD-10-CM | POA: Insufficient documentation

## 2021-07-28 LAB — URINALYSIS, ROUTINE W REFLEX MICROSCOPIC
Bilirubin Urine: NEGATIVE
Glucose, UA: NEGATIVE mg/dL
Hgb urine dipstick: NEGATIVE
Ketones, ur: NEGATIVE mg/dL
Leukocytes,Ua: NEGATIVE
Nitrite: NEGATIVE
Protein, ur: NEGATIVE mg/dL
Specific Gravity, Urine: 1.025 (ref 1.005–1.030)
pH: 6 (ref 5.0–8.0)

## 2021-07-28 LAB — HIV ANTIBODY (ROUTINE TESTING W REFLEX): HIV Screen 4th Generation wRfx: NONREACTIVE

## 2021-07-28 MED ORDER — PENICILLIN G BENZATHINE 1200000 UNIT/2ML IM SUSY
2.4000 10*6.[IU] | PREFILLED_SYRINGE | Freq: Once | INTRAMUSCULAR | Status: AC
  Administered 2021-07-28: 2.4 10*6.[IU] via INTRAMUSCULAR
  Filled 2021-07-28: qty 4

## 2021-07-28 MED ORDER — IBUPROFEN 800 MG PO TABS
800.0000 mg | ORAL_TABLET | Freq: Once | ORAL | Status: AC
  Administered 2021-07-28: 800 mg via ORAL
  Filled 2021-07-28: qty 1

## 2021-07-28 MED ORDER — DOXYCYCLINE HYCLATE 100 MG PO CAPS: 100.0000 mg | ORAL_CAPSULE | Freq: Two times a day (BID) | ORAL | 0 refills | Status: DC

## 2021-07-28 NOTE — Discharge Instructions (Addendum)
You have been evaluated for your enlarged groin lymph nodes as well as ulceration at the base of your penis.  You were given antibiotic to cover for potential infection.  Please check MyChart, link below for the remaining of your lab results.  If you develop fever headache worsening rash or you have any concern please return for further evaluation.

## 2021-07-28 NOTE — ED Provider Notes (Signed)
[facility] EMERGENCY DEPARTMENT Provider Note   CSN: [id number] Arrival date & time: 07/28/21  3299     History No chief complaint on file.   Derrick Hurst is a 35 y.o. adult.  The history is provided by the patient. No language interpreter was used.   35 year old male presenting complaining of swollen lymph node.  Patient report for the past 3 to 4 days he has noticed pain and swelling to his left groin region and he also noticed a painful lesion on his penis.  His significant other asked him to get tested for potential STD.  He denies any new sexual partner and denies having prior STI.  No fever headache body aches no discharge no dysuria and no hematuria.  No specific treatment tried.  Patient is sexually active with opposite sex.  History reviewed. No pertinent past medical history.  There are no problems to display for this patient.   History reviewed. No pertinent surgical history.     No family history on file.  Social History   Tobacco Use   Smoking status: Every Day    Types: Cigarettes   Smokeless tobacco: Never  Substance Use Topics   Alcohol use: Yes    Comment: occ   Drug use: Yes    Types: Marijuana    Home Medications Prior to Admission medications   Medication Sig Start Date End Date Taking? Authorizing Provider  ibuprofen (ADVIL) 800 MG tablet Take 1 tablet (800 mg total) by mouth 3 (three) times daily. 12/13/20   Ward, Layla Maw, DO  ondansetron (ZOFRAN ODT) 4 MG disintegrating tablet Take 1 tablet (4 mg total) by mouth every 6 (six) hours as needed for nausea or vomiting. 12/13/20   Ward, Layla Maw, DO  penicillin v potassium (VEETID) 500 MG tablet Take 1 tablet (500 mg total) by mouth 4 (four) times daily. 12/13/20   Ward, Layla Maw, DO    Allergies    Patient has no known allergies.  Review of Systems   Review of Systems  All other systems reviewed and are negative.  Physical Exam Updated Vital Signs BP 122/77 (BP  Location: Right Arm)   Pulse 81   Temp 98.4 F (36.9 C)   Resp 17   SpO2 96%   Physical Exam Vitals and nursing note reviewed.  Constitutional:      Appearance: She is well-developed.  HENT:     Head: Normocephalic and atraumatic.  Eyes:     Conjunctiva/sclera: Conjunctivae normal.  Cardiovascular:     Rate and Rhythm: Normal rate and regular rhythm.     Heart sounds: No murmur heard. Pulmonary:     Effort: Pulmonary effort is normal. No respiratory distress.     Breath sounds: Normal breath sounds.  Abdominal:     Palpations: Abdomen is soft.     Tenderness: There is no abdominal tenderness.  Genitourinary:    Comments: Crystal, RN, available to chaperone.  Left inguinal lymphadenopathy noted tender to palpation.  There is a 2cm ulceration noted to the base of the shaft of the penis tenderness to palpation with surrounding edema.  Circumcised penis free of discharge.  Testicle nontender with normal lie, normal scrotum. Musculoskeletal:     Cervical back: Neck supple.  Skin:    General: Skin is warm and dry.  Neurological:     Mental Status: She is alert.    ED Results / Procedures / Treatments   Labs (all labs ordered are listed, but only  abnormal results are displayed) Labs Reviewed  URINALYSIS, ROUTINE W REFLEX MICROSCOPIC - Abnormal; Notable for the following components:      Result Value   APPearance HAZY (*)    All other components within normal limits  RPR  HIV ANTIBODY (ROUTINE TESTING W REFLEX)  GC/CHLAMYDIA PROBE AMP ([place]) NOT AT [facility]    EKG None  Radiology No results found.  Procedures Procedures   Medications Ordered in ED Medications  penicillin g benzathine (BICILLIN LA) 1200000 UNIT/2ML injection 2.4 Million Units (2.4 Million Units Intramuscular Given 07/28/21 1109)  ibuprofen (ADVIL) tablet 800 mg (800 mg Oral Given 07/28/21 1108)    ED Course  I have reviewed the triage vital signs and the nursing notes.  Pertinent labs & imaging  results that were available during my care of the patient were reviewed by me and considered in my medical decision making (see chart for details).    MDM Rules/Calculators/A&P                           BP 122/77 (BP Location: Right Arm)   Pulse 81   Temp 98.4 F (36.9 C)   Resp 17   SpO2 96%   Final Clinical Impression(s) / ED Diagnoses Final diagnoses:  Lymphadenopathy, inguinal  Penile ulcer    Rx / DC Orders ED Discharge Orders          Ordered    doxycycline (VIBRAMYCIN) 100 MG capsule  2 times daily        07/28/21 1141           Patient here complaining of a painful ulceration to the shaft of his penis as well as left inguinal lymphadenopathy.  This lesion has an appearance suggestive of either chancre or chancroid but it is painful which makes it a little bit less likely to be syphilis.  However, due to concerns, will treat for potential syphilis with penicillin G 2,400,000 unit.  I have considered monkey pox as a potential causative agent however patient without any viral symptoms, denies any new sexual partners or any recent travel and his partner is without any active symptoms at this time.  The ulceration is dry and therefore I am unable to obtain any viral culture.   Fayrene Helper, PA-C 07/28/21 1158    Curatolo, Adam, DO 07/28/21 1452

## 2021-07-28 NOTE — ED Notes (Signed)
Patient Alert and oriented to baseline. Stable and ambulatory to baseline. Patient verbalized understanding of the discharge instructions.  Patient belongings were taken by the patient.   

## 2021-07-28 NOTE — ED Triage Notes (Signed)
C/o L groin pain x 3-4 days.  Denies urinary complaints.

## 2021-07-29 LAB — RPR: RPR Ser Ql: NONREACTIVE

## 2021-08-30 ENCOUNTER — Other Ambulatory Visit: Payer: Self-pay

## 2021-08-30 ENCOUNTER — Encounter (HOSPITAL_BASED_OUTPATIENT_CLINIC_OR_DEPARTMENT_OTHER): Payer: Self-pay | Admitting: *Deleted

## 2021-08-30 DIAGNOSIS — F17210 Nicotine dependence, cigarettes, uncomplicated: Secondary | ICD-10-CM | POA: Insufficient documentation

## 2021-08-30 DIAGNOSIS — L02215 Cutaneous abscess of perineum: Secondary | ICD-10-CM | POA: Insufficient documentation

## 2021-08-30 NOTE — ED Triage Notes (Signed)
C/o abscess to groin x 4 days

## 2021-08-31 ENCOUNTER — Emergency Department (HOSPITAL_BASED_OUTPATIENT_CLINIC_OR_DEPARTMENT_OTHER)
Admission: EM | Admit: 2021-08-31 | Discharge: 2021-08-31 | Disposition: A | Discharge status: Home / Self Care | Payer: Self-pay | Attending: Emergency Medicine | Admitting: Emergency Medicine

## 2021-08-31 DIAGNOSIS — L02215 Cutaneous abscess of perineum: Secondary | ICD-10-CM

## 2021-08-31 HISTORY — DX: Dermatitis, unspecified: L30.9

## 2021-08-31 MED ORDER — HYDROCODONE-ACETAMINOPHEN 5-325 MG PO TABS: 1.0000 | ORAL_TABLET | Freq: Four times a day (QID) | ORAL | 0 refills | Status: DC | PRN

## 2021-08-31 MED ORDER — CLINDAMYCIN HCL 150 MG PO CAPS: 450.0000 mg | ORAL_CAPSULE | Freq: Three times a day (TID) | ORAL | 0 refills | Status: DC

## 2021-08-31 MED ORDER — CLINDAMYCIN HCL 150 MG PO CAPS
450.0000 mg | ORAL_CAPSULE | Freq: Once | ORAL | Status: AC
  Administered 2021-08-31: 450 mg via ORAL
  Filled 2021-08-31: qty 3

## 2021-08-31 MED ORDER — IBUPROFEN 800 MG PO TABS
800.0000 mg | ORAL_TABLET | Freq: Once | ORAL | Status: AC
  Administered 2021-08-31: 800 mg via ORAL
  Filled 2021-08-31: qty 1

## 2021-08-31 NOTE — ED Provider Notes (Signed)
[facility] EMERGENCY DEPARTMENT Provider Note   CSN: [id number] Arrival date & time: 08/30/21  2226     History Chief Complaint  Patient presents with   Abscess    Derrick Hurst is a 35 y.o. adult.  The history is provided by the patient.  Abscess Derrick Hurst is a 35 y.o. adult who presents to the Emergency Department complaining of abscess. He presents the emergency department for evaluation of multiple genital abscesses that started on Monday of this week. He states he initially started as areas that he scratched from his eczema. He reports increasing pain to the area and drainage started yesterday. No associated fevers, nausea, vomiting, dysuria, pain with bowel movements. He has no additional medical problems and takes no medications. He is not currently sexually active. No history of abscess. No prior similar symptoms.    Past Medical History:  Diagnosis Date   Eczema     There are no problems to display for this patient.   History reviewed. No pertinent surgical history.     No family history on file.  Social History   Tobacco Use   Smoking status: Every Day    Types: Cigarettes   Smokeless tobacco: Never  Substance Use Topics   Alcohol use: Yes    Comment: occ   Drug use: Yes    Types: Marijuana    Home Medications Prior to Admission medications   Medication Sig Start Date End Date Taking? Authorizing Provider  clindamycin (CLEOCIN) 150 MG capsule Take 3 capsules (450 mg total) by mouth 3 (three) times daily. 08/31/21  Yes Tilden Fossa, MD  HYDROcodone-acetaminophen (NORCO/VICODIN) 5-325 MG tablet Take 1 tablet by mouth every 6 (six) hours as needed. 08/31/21  Yes Tilden Fossa, MD  doxycycline (VIBRAMYCIN) 100 MG capsule Take 1 capsule (100 mg total) by mouth 2 (two) times daily. 07/28/21   Fayrene Helper, PA-C  ibuprofen (ADVIL) 800 MG tablet Take 1 tablet (800 mg total) by mouth 3 (three) times daily. 12/13/20   Ward, Layla Maw, DO   ondansetron (ZOFRAN ODT) 4 MG disintegrating tablet Take 1 tablet (4 mg total) by mouth every 6 (six) hours as needed for nausea or vomiting. 12/13/20   Ward, Layla Maw, DO  penicillin v potassium (VEETID) 500 MG tablet Take 1 tablet (500 mg total) by mouth 4 (four) times daily. 12/13/20   Ward, Layla Maw, DO    Allergies    Patient has no known allergies.  Review of Systems   Review of Systems  All other systems reviewed and are negative.  Physical Exam Updated Vital Signs BP (!) 143/99   Pulse 85   Temp 98.6 F (37 C) (Oral)   Resp 16   Ht 5\' 11"  (1.803 m)   Wt 78.9 kg   SpO2 99%   BMI 24.27 kg/m   Physical Exam Vitals and nursing note reviewed.  Constitutional:      Appearance: Derrick Hurst is well-developed.  HENT:     Head: Normocephalic and atraumatic.  Cardiovascular:     Rate and Rhythm: Normal rate and regular rhythm.  Pulmonary:     Effort: Pulmonary effort is normal.     Breath sounds: Normal breath sounds.  Genitourinary:    Comments: There are four discrete abscesses in the perineal region. All of these have a superficial ulceration in the center with spontaneous drainage of moderate purulent material. There is local induration. Abscesses are located at the base of the scrotum and posteriorly. None involve  the rectum. There is no crepitance Musculoskeletal:        General: No tenderness.  Skin:    General: Skin is warm and dry.  Neurological:     Mental Status: Derrick Hurst is alert and oriented to person, place, and time.  Psychiatric:        Behavior: Behavior normal.    ED Results / Procedures / Treatments   Labs (all labs ordered are listed, but only abnormal results are displayed) Labs Reviewed - No data to display  EKG None  Radiology No results found.  Procedures Procedures   Medications Ordered in ED Medications  clindamycin (CLEOCIN) capsule 450 mg (has no administration in time range)  ibuprofen (ADVIL) tablet 800 mg (has no administration in  time range)    ED Course  I have reviewed the triage vital signs and the nursing notes.  Pertinent labs & imaging results that were available during my care of the patient were reviewed by me and considered in my medical decision making (see chart for details).    MDM Rules/Calculators/A&P                          patient here for evaluation of abscesses. He does have multiple spontaneously draining abscess is on examination. Examination is not consistent with monkeypox, sepsis, Fournier's. Discussed with patient home care for abscess. Will start antibiotics with sitz baths. Discussed outpatient follow-up and return precautions.  Final Clinical Impression(s) / ED Diagnoses Final diagnoses:  Perineal abscess    Rx / DC Orders ED Discharge Orders          Ordered    HYDROcodone-acetaminophen (NORCO/VICODIN) 5-325 MG tablet  Every 6 hours PRN        08/31/21 0205    clindamycin (CLEOCIN) 150 MG capsule  3 times daily        08/31/21 0205             Tilden Fossa, MD 08/31/21 0210

## 2021-11-30 ENCOUNTER — Emergency Department (HOSPITAL_BASED_OUTPATIENT_CLINIC_OR_DEPARTMENT_OTHER)
Admission: EM | Admit: 2021-11-30 | Discharge: 2021-11-30 | Discharge status: Against Medical Advice | Payer: BC Managed Care – PPO | Attending: Emergency Medicine | Admitting: Emergency Medicine

## 2021-11-30 ENCOUNTER — Other Ambulatory Visit: Payer: Self-pay

## 2021-11-30 ENCOUNTER — Encounter (HOSPITAL_BASED_OUTPATIENT_CLINIC_OR_DEPARTMENT_OTHER): Payer: Self-pay | Admitting: *Deleted

## 2021-11-30 DIAGNOSIS — R220 Localized swelling, mass and lump, head: Secondary | ICD-10-CM | POA: Diagnosis not present

## 2021-11-30 DIAGNOSIS — K0889 Other specified disorders of teeth and supporting structures: Secondary | ICD-10-CM | POA: Insufficient documentation

## 2021-11-30 DIAGNOSIS — Z5321 Procedure and treatment not carried out due to patient leaving prior to being seen by health care provider: Secondary | ICD-10-CM | POA: Diagnosis not present

## 2021-11-30 NOTE — ED Triage Notes (Signed)
Swelling to the right side of his jaw. Dental pain.

## 2021-11-30 NOTE — ED Notes (Signed)
Unable to locate patient.

## 2021-11-30 NOTE — ED Notes (Signed)
PA went to see patient, patient not in room.

## 2022-07-12 ENCOUNTER — Other Ambulatory Visit: Payer: Self-pay

## 2022-07-12 ENCOUNTER — Encounter (HOSPITAL_BASED_OUTPATIENT_CLINIC_OR_DEPARTMENT_OTHER): Payer: Self-pay | Admitting: Emergency Medicine

## 2022-07-12 ENCOUNTER — Emergency Department (HOSPITAL_BASED_OUTPATIENT_CLINIC_OR_DEPARTMENT_OTHER)
Admission: EM | Admit: 2022-07-12 | Discharge: 2022-07-12 | Disposition: A | Discharge status: Home / Self Care | Payer: BC Managed Care – PPO | Attending: Emergency Medicine | Admitting: Emergency Medicine

## 2022-07-12 DIAGNOSIS — J020 Streptococcal pharyngitis: Secondary | ICD-10-CM | POA: Insufficient documentation

## 2022-07-12 DIAGNOSIS — Z716 Tobacco abuse counseling: Secondary | ICD-10-CM | POA: Insufficient documentation

## 2022-07-12 DIAGNOSIS — Z20822 Contact with and (suspected) exposure to covid-19: Secondary | ICD-10-CM | POA: Insufficient documentation

## 2022-07-12 DIAGNOSIS — F17210 Nicotine dependence, cigarettes, uncomplicated: Secondary | ICD-10-CM | POA: Insufficient documentation

## 2022-07-12 LAB — RESP PANEL BY RT-PCR (FLU A&B, COVID) ARPGX2
Influenza A by PCR: NEGATIVE
Influenza B by PCR: NEGATIVE
SARS Coronavirus 2 by RT PCR: NEGATIVE

## 2022-07-12 LAB — GROUP A STREP BY PCR: Group A Strep by PCR: DETECTED — AB

## 2022-07-12 MED ORDER — LIDOCAINE 5 % EX PTCH
1.0000 | MEDICATED_PATCH | CUTANEOUS | Status: DC
  Administered 2022-07-12: 1 via TRANSDERMAL
  Filled 2022-07-12: qty 1

## 2022-07-12 MED ORDER — AMOXICILLIN 500 MG PO CAPS: 500.0000 mg | ORAL_CAPSULE | Freq: Two times a day (BID) | ORAL | 0 refills | Status: DC

## 2022-07-12 MED ORDER — CEPACOL REGULAR STRENGTH 3 MG MT LOZG
1.0000 | LOZENGE | OROMUCOSAL | 0 refills | Status: AC | PRN
Start: 2022-07-12 — End: ?

## 2022-07-12 MED ORDER — METHOCARBAMOL 500 MG PO TABS: 1000.0000 mg | ORAL_TABLET | Freq: Two times a day (BID) | ORAL | 0 refills | Status: AC

## 2022-07-12 MED ORDER — KETOROLAC TROMETHAMINE 60 MG/2ML IM SOLN
60.0000 mg | Freq: Once | INTRAMUSCULAR | Status: DC
  Filled 2022-07-12: qty 2

## 2022-07-12 NOTE — Discharge Instructions (Addendum)
It was a pleasure caring for you today in the emergency department. ° °Please return to the emergency department for any worsening or worrisome symptoms. ° ° °

## 2022-07-12 NOTE — ED Provider Notes (Signed)
[facility] EMERGENCY DEPARTMENT Provider Note   CSN: [id number] Arrival date & time: 07/12/22  0141     History  Chief Complaint  Patient presents with   Generalized Body Aches    Derrick Hurst is a 36 y.o. adult.  Patient as above with significant medical history as below, including eczema who presents to the ED with complaint of flulike symptoms, body aches, poor appetite, chills, rhinorrhea, sore throat.  Symptoms ongoing for the past few days.  He was instructed to go to the emergency department because he missed work today.  No coughing.  No dyspnea.  No chest pain, dyspnea, nausea or vomiting.  No abdominal pain.  No change in bowel or bladder function.  No rashes.  No known sick contacts or recent travel  He has no GU complaints   Past Medical History:  Diagnosis Date   Eczema     History reviewed. No pertinent surgical history.   The history is provided by the patient. No language interpreter was used.       Home Medications Prior to Admission medications   Medication Sig Start Date End Date Taking? Authorizing Provider  amoxicillin (AMOXIL) 500 MG capsule Take 1 capsule (500 mg total) by mouth 2 (two) times daily for 10 days. 07/12/22 07/22/22 Yes Sloan Leiter, DO  methocarbamol (ROBAXIN) 500 MG tablet Take 2 tablets (1,000 mg total) by mouth 2 (two) times daily for 5 days. 07/12/22 07/17/22 Yes Sloan Leiter, DO  clindamycin (CLEOCIN) 150 MG capsule Take 3 capsules (450 mg total) by mouth 3 (three) times daily. 08/31/21   Tilden Fossa, MD  doxycycline (VIBRAMYCIN) 100 MG capsule Take 1 capsule (100 mg total) by mouth 2 (two) times daily. 07/28/21   Fayrene Helper, PA-C  HYDROcodone-acetaminophen (NORCO/VICODIN) 5-325 MG tablet Take 1 tablet by mouth every 6 (six) hours as needed. 08/31/21   Tilden Fossa, MD  ibuprofen (ADVIL) 800 MG tablet Take 1 tablet (800 mg total) by mouth 3 (three) times daily. 12/13/20   Ward, Layla Maw, DO  ondansetron (ZOFRAN ODT)  4 MG disintegrating tablet Take 1 tablet (4 mg total) by mouth every 6 (six) hours as needed for nausea or vomiting. 12/13/20   Ward, Layla Maw, DO  penicillin v potassium (VEETID) 500 MG tablet Take 1 tablet (500 mg total) by mouth 4 (four) times daily. 12/13/20   Ward, Layla Maw, DO      Allergies    Patient has no known allergies.    Review of Systems   Review of Systems  Constitutional:  Positive for chills and fatigue. Negative for activity change.  HENT:  Positive for congestion and sore throat. Negative for facial swelling and trouble swallowing.   Eyes:  Negative for discharge and redness.  Respiratory:  Negative for cough and shortness of breath.   Cardiovascular:  Negative for chest pain and palpitations.  Gastrointestinal:  Negative for abdominal pain and nausea.  Genitourinary:  Negative for dysuria and flank pain.  Musculoskeletal:  Positive for arthralgias. Negative for back pain and gait problem.  Skin:  Negative for pallor and rash.  Neurological:  Negative for syncope and headaches.    Physical Exam Updated Vital Signs BP 119/71   Pulse 93   Temp 100.3 F (37.9 C) (Oral)   Resp 18   Ht 5\' 10"  (1.778 m)   Wt 81.6 kg   SpO2 98%   BMI 25.83 kg/m  Physical Exam Vitals and nursing note reviewed.  Constitutional:  General: She is not in acute distress.    Appearance: Normal appearance.  HENT:     Head: Normocephalic and atraumatic.     Right Ear: External ear normal.     Left Ear: External ear normal.     Nose: Nose normal.     Mouth/Throat:     Mouth: Mucous membranes are moist.  Eyes:     General: No scleral icterus.       Right eye: No discharge.        Left eye: No discharge.  Cardiovascular:     Rate and Rhythm: Normal rate and regular rhythm.     Pulses: Normal pulses.     Heart sounds: Normal heart sounds.  Pulmonary:     Effort: Pulmonary effort is normal. No respiratory distress.     Breath sounds: Normal breath sounds. No stridor.   Abdominal:     General: Abdomen is flat.     Palpations: Abdomen is soft.     Tenderness: There is no abdominal tenderness.  Musculoskeletal:        General: Normal range of motion.     Cervical back: Normal range of motion. No rigidity.     Right lower leg: No edema.     Left lower leg: No edema.  Skin:    General: Skin is warm and dry.     Capillary Refill: Capillary refill takes less than 2 seconds.  Neurological:     Mental Status: She is alert and oriented to person, place, and time.     GCS: GCS eye subscore is 4. GCS verbal subscore is 5. GCS motor subscore is 6.  Psychiatric:        Mood and Affect: Mood normal.        Behavior: Behavior normal.     ED Results / Procedures / Treatments   Labs (all labs ordered are listed, but only abnormal results are displayed) Labs Reviewed  GROUP A STREP BY PCR - Abnormal; Notable for the following components:      Result Value   Group A Strep by PCR DETECTED (*)    All other components within normal limits  RESP PANEL BY RT-PCR (FLU A&B, COVID) [id number]    EKG None  Radiology No results found.  Procedures Procedures    Medications Ordered in ED Medications  ketorolac (TORADOL) injection 60 mg (60 mg Intramuscular Patient Refused/Not Given 07/12/22 0240)  lidocaine (LIDODERM) 5 % 1 patch (1 patch Transdermal Patch Applied 07/12/22 0241)    ED Course/ Medical Decision Making/ A&P Clinical Course as of 07/12/22 0349  Fri Jul 12, 2022  0257 Patient refused Toradol. [SG]    Clinical Course User Index [SG] Sloan Leiter, DO                           Medical Decision Making Risk Prescription drug management.    CC: Flulike symptoms  This patient presents to the Emergency Department for the above complaint. This involves an extensive number of treatment options and is a complaint that carries with it a high risk of complications and morbidity. Vital signs were reviewed. Serious etiologies considered.  DDx includes  not limited to viral syndrome, COVID-19, influenza, bacterial syndrome, other acute etiologies are considered  Record review:  Previous records obtained and reviewed prior ED visits, prior labs and imaging  Additional history obtained from N/A  Medical and surgical history as noted above.   Work up  as above, notable for:  Labs & imaging results that were available during my care of the patient were visualized by me and considered in my medical decision making.  Physical exam as above.   Send viral swabs, strep swab  Management: Toradol, lidocaine patch  ED Course: Clinical Course as of 07/12/22 0349  Fri Jul 12, 2022  0257 Patient refused Toradol. [SG]    Clinical Course User Index [SG] Sloan Leiter, DO     Reassessment:  Patient feeling better.  He is tolerant p.o. intake without difficulty.  Strep screen positive, will start amoxicillin.  He is breathing comfortably On ambient air.  No dysphagia or dysphonia.  No drooling, stridor or trismus.  Tolerating p.o. intake without difficulty.  Admission was considered.   The patient improved significantly and was discharged in stable condition. Detailed discussions were had with the patient regarding current findings, and need for close f/u with PCP or on call doctor. The patient has been instructed to return immediately if the symptoms worsen in any way for re-evaluation. Patient verbalized understanding and is in agreement with current care plan. All questions answered prior to discharge.               Social determinants of health include -  Tobacco use, marijuana use, no PCP  Counseled patient for approximately 3.5 minutes regarding smoking cessation. Discussed risks of smoking and how they applied and affected their visit here today. Patient not ready to quit at this time, however will follow up with their primary doctor when they are.   CPT code: 80998: intermediate counseling for smoking  cessation   Social History   Socioeconomic History   Marital status: Single    Spouse name: Not on file   Number of children: Not on file   Years of education: Not on file   Highest education level: Not on file  Occupational History   Not on file  Tobacco Use   Smoking status: Every Day    Types: Cigarettes   Smokeless tobacco: Never  Vaping Use   Vaping Use: Never used  Substance and Sexual Activity   Alcohol use: Yes    Comment: occ   Drug use: Yes    Types: Marijuana   Sexual activity: Not on file  Other Topics Concern   Not on file  Social History Narrative   Not on file   Social Determinants of Health   Financial Resource Strain: Not on file  Food Insecurity: Not on file  Transportation Needs: Not on file  Physical Activity: Not on file  Stress: Not on file  Social Connections: Not on file  Intimate Partner Violence: Not on file      This chart was dictated using voice recognition software.  Despite best efforts to proofread,  errors can occur which can change the documentation meaning.         Final Clinical Impression(s) / ED Diagnoses Final diagnoses:  Strep pharyngitis    Rx / DC Orders ED Discharge Orders          Ordered    amoxicillin (AMOXIL) 500 MG capsule  2 times daily        07/12/22 0347    methocarbamol (ROBAXIN) 500 MG tablet  2 times daily        07/12/22 0347              Sloan Leiter, DO 07/12/22 [phone]

## 2022-07-12 NOTE — ED Triage Notes (Signed)
Pt with body aches, chills, sweating.

## 2022-07-18 ENCOUNTER — Emergency Department (HOSPITAL_BASED_OUTPATIENT_CLINIC_OR_DEPARTMENT_OTHER)
Admission: EM | Admit: 2022-07-18 | Discharge: 2022-07-18 | Disposition: A | Discharge status: Home / Self Care | Payer: Self-pay | Attending: Emergency Medicine | Admitting: Emergency Medicine

## 2022-07-18 ENCOUNTER — Other Ambulatory Visit: Payer: Self-pay

## 2022-07-18 DIAGNOSIS — Z20822 Contact with and (suspected) exposure to covid-19: Secondary | ICD-10-CM | POA: Insufficient documentation

## 2022-07-18 DIAGNOSIS — B349 Viral infection, unspecified: Secondary | ICD-10-CM | POA: Insufficient documentation

## 2022-07-18 LAB — CBC WITH DIFFERENTIAL/PLATELET
Abs Immature Granulocytes: 0.01 10*3/uL (ref 0.00–0.07)
Basophils Absolute: 0.1 10*3/uL (ref 0.0–0.1)
Basophils Relative: 1 %
Eosinophils Absolute: 0.3 10*3/uL (ref 0.0–0.5)
Eosinophils Relative: 4 %
HCT: 40.6 % (ref 39.0–52.0)
Hemoglobin: 13.2 g/dL (ref 13.0–17.0)
Immature Granulocytes: 0 %
Lymphocytes Relative: 49 %
Lymphs Abs: 3.9 10*3/uL (ref 0.7–4.0)
MCH: 27.5 pg (ref 26.0–34.0)
MCHC: 32.5 g/dL (ref 30.0–36.0)
MCV: 84.6 fL (ref 80.0–100.0)
Monocytes Absolute: 0.5 10*3/uL (ref 0.1–1.0)
Monocytes Relative: 6 %
Neutro Abs: 3.1 10*3/uL (ref 1.7–7.7)
Neutrophils Relative %: 40 %
Platelets: 181 10*3/uL (ref 150–400)
RBC: 4.8 MIL/uL (ref 4.22–5.81)
RDW: 13.5 % (ref 11.5–15.5)
WBC: 7.9 10*3/uL (ref 4.0–10.5)
nRBC: 0 % (ref 0.0–0.2)

## 2022-07-18 LAB — URINALYSIS, MICROSCOPIC (REFLEX): WBC, UA: NONE SEEN WBC/hpf (ref 0–5)

## 2022-07-18 LAB — COMPREHENSIVE METABOLIC PANEL
ALT: 228 U/L — ABNORMAL HIGH (ref 0–44)
AST: 210 U/L — ABNORMAL HIGH (ref 15–41)
Albumin: 3.3 g/dL — ABNORMAL LOW (ref 3.5–5.0)
Alkaline Phosphatase: 86 U/L (ref 38–126)
Anion gap: 8 (ref 5–15)
BUN: 8 mg/dL (ref 6–20)
CO2: 26 mmol/L (ref 22–32)
Calcium: 8.1 mg/dL — ABNORMAL LOW (ref 8.9–10.3)
Chloride: 102 mmol/L (ref 98–111)
Creatinine, Ser: 1.11 mg/dL (ref 0.61–1.24)
GFR, Estimated: >60 mL/min (ref >60)
Glucose, Bld: 108 mg/dL — ABNORMAL HIGH (ref 70–99)
Potassium: 3.7 mmol/L (ref 3.5–5.1)
Sodium: 136 mmol/L (ref 135–145)
Total Bilirubin: 0.7 mg/dL (ref 0.3–1.2)
Total Protein: 7.4 g/dL (ref 6.5–8.1)

## 2022-07-18 LAB — URINALYSIS, ROUTINE W REFLEX MICROSCOPIC
Glucose, UA: NEGATIVE mg/dL
Ketones, ur: NEGATIVE mg/dL
Leukocytes,Ua: NEGATIVE
Nitrite: NEGATIVE
Protein, ur: 100 mg/dL — AB
Specific Gravity, Urine: 1.015 (ref 1.005–1.030)
pH: 7 (ref 5.0–8.0)

## 2022-07-18 LAB — SARS CORONAVIRUS 2 BY RT PCR: SARS Coronavirus 2 by RT PCR: NEGATIVE

## 2022-07-18 MED ORDER — ACETAMINOPHEN 325 MG PO TABS
650.0000 mg | ORAL_TABLET | Freq: Once | ORAL | Status: AC | PRN
Start: 2022-07-18 — End: 2022-07-18
  Administered 2022-07-18: 650 mg via ORAL
  Filled 2022-07-18: qty 2

## 2022-07-18 MED ORDER — IBUPROFEN 800 MG PO TABS: 800.0000 mg | ORAL_TABLET | Freq: Three times a day (TID) | ORAL | 0 refills | Status: AC

## 2022-07-18 MED ORDER — SODIUM CHLORIDE 0.9 % IV BOLUS
1000.0000 mL | Freq: Once | INTRAVENOUS | Status: AC
Start: 2022-07-18 — End: 2022-07-18
  Administered 2022-07-18: 1000 mL via INTRAVENOUS

## 2022-07-18 MED ORDER — KETOROLAC TROMETHAMINE 15 MG/ML IJ SOLN
15.0000 mg | Freq: Once | INTRAMUSCULAR | Status: AC
  Administered 2022-07-18: 15 mg via INTRAVENOUS
  Filled 2022-07-18: qty 1

## 2022-07-18 MED ORDER — DIPHENHYDRAMINE HCL 50 MG/ML IJ SOLN
25.0000 mg | Freq: Once | INTRAMUSCULAR | Status: AC
  Administered 2022-07-18: 25 mg via INTRAVENOUS
  Filled 2022-07-18: qty 1

## 2022-07-18 MED ORDER — PROCHLORPERAZINE EDISYLATE 10 MG/2ML IJ SOLN
10.0000 mg | Freq: Once | INTRAMUSCULAR | Status: AC
  Administered 2022-07-18: 10 mg via INTRAVENOUS
  Filled 2022-07-18: qty 2

## 2022-07-18 MED ORDER — DOXYCYCLINE HYCLATE 100 MG PO CAPS: 100.0000 mg | ORAL_CAPSULE | Freq: Two times a day (BID) | ORAL | 0 refills | Status: DC

## 2022-07-18 MED ORDER — DOXYCYCLINE HYCLATE 100 MG PO CAPS
100.0000 mg | ORAL_CAPSULE | Freq: Two times a day (BID) | ORAL | 0 refills | Status: AC
Start: 2022-07-18 — End: ?

## 2022-07-18 MED ORDER — ACETAMINOPHEN 325 MG PO TABS
325.0000 mg | ORAL_TABLET | Freq: Four times a day (QID) | ORAL | Status: DC | PRN
Start: 2022-07-18 — End: 2022-07-19
  Administered 2022-07-18: 325 mg via ORAL
  Filled 2022-07-18: qty 1

## 2022-07-18 MED ORDER — DOXYCYCLINE HYCLATE 100 MG PO TABS
100.0000 mg | ORAL_TABLET | Freq: Once | ORAL | Status: AC
  Administered 2022-07-18: 100 mg via ORAL
  Filled 2022-07-18: qty 1

## 2022-07-18 MED ORDER — SODIUM CHLORIDE 0.9 % IV BOLUS
1000.0000 mL | Freq: Once | INTRAVENOUS | Status: AC
  Administered 2022-07-18: 1000 mL via INTRAVENOUS

## 2022-07-18 NOTE — ED Provider Notes (Signed)
[facility] EMERGENCY DEPARTMENT Provider Note   CSN: [id number] Arrival date & time: 07/18/22  1650     History  Chief Complaint  Patient presents with   Weakness    Derrick Hurst is a 36 y.o. adult.  Derrick Hurst is a 36 year old adult who presents with worsening fevers, chills, myalgias, and bilateral hip/knee pain.  He was seen on 7/21 for similar symptoms and was tested for strep throat which returned positive.  He has been on amoxicillin since this diagnosis but has had progression of his symptoms.  He was also prescribed Robaxin at this visit.  He notes a right-sided posterior headache with some shooting pains down his neck.  He states his hips and his knees are very painful to move and has made it hard for him to walk.  He is not having any sore throat, rhinorrhea, cough, shortness of breath, chest pain.  He denies any urogenital or GI/rectal complaints.  He does note that he had a tick on him about 2 months ago that he does not member how long was attached to him and he did not seek any medical attention for this.   Weakness Associated symptoms: arthralgias, fever, headaches, myalgias, nausea and vomiting   Associated symptoms: no abdominal pain, no chest pain, no cough, no diarrhea, no dysuria and no shortness of breath        Home Medications Prior to Admission medications   Medication Sig Start Date End Date Taking? Authorizing Provider  doxycycline (VIBRAMYCIN) 100 MG capsule Take 1 capsule (100 mg total) by mouth 2 (two) times daily. 07/18/22   Rocky Morel, DO  ibuprofen (ADVIL) 800 MG tablet Take 1 tablet (800 mg total) by mouth 3 (three) times daily. 07/18/22   Rocky Morel, DO  menthol-cetylpyridinium (CEPACOL REGULAR STRENGTH) 3 MG lozenge Take 1 lozenge (3 mg total) by mouth as needed for sore throat. 07/12/22   Sloan Leiter, DO      Allergies    Patient has no known allergies.    Review of Systems   Review of Systems  Constitutional:   Positive for chills, fatigue and fever.  HENT:  Negative for postnasal drip, rhinorrhea and sneezing.   Eyes:  Positive for photophobia.  Respiratory:  Negative for cough and shortness of breath.   Cardiovascular:  Negative for chest pain and leg swelling.  Gastrointestinal:  Positive for nausea and vomiting. Negative for abdominal pain, constipation and diarrhea.  Genitourinary:  Positive for difficulty urinating. Negative for dysuria, penile pain and testicular pain.  Musculoskeletal:  Positive for arthralgias, back pain, joint swelling and myalgias.  Neurological:  Positive for weakness and headaches. Negative for syncope.    Physical Exam Updated Vital Signs BP 124/90   Pulse 88   Temp 99.1 F (37.3 C) (Oral)   Resp 18   Ht 5\' 10"  (1.778 m)   Wt 81.6 kg   SpO2 99%   BMI 25.82 kg/m  Physical Exam Vitals reviewed.  Constitutional:      Comments: Middle-aged adult who appears uncomfortable but is not toxic appearing.  HENT:     Head: Normocephalic and atraumatic.  Eyes:     General: No scleral icterus. Cardiovascular:     Rate and Rhythm: Normal rate and regular rhythm.  Pulmonary:     Effort: Pulmonary effort is normal.     Breath sounds: Normal breath sounds.  Abdominal:     Palpations: Abdomen is soft.     Tenderness: There is no  abdominal tenderness.  Musculoskeletal:     Cervical back: No rigidity or tenderness.     Thoracic back: Tenderness present.     Lumbar back: Tenderness present.     Right hip: Tenderness present.     Left hip: Tenderness present.     Right lower leg: No edema.     Left lower leg: No edema.  Neurological:     Mental Status: She is alert.     Cranial Nerves: No dysarthria or facial asymmetry.     Motor: Weakness (Weakness in bilateral lower extremities to hip flexion, exam limited by pain) present.     ED Results / Procedures / Treatments   Labs (all labs ordered are listed, but only abnormal results are displayed) Labs Reviewed   COMPREHENSIVE METABOLIC PANEL - Abnormal; Notable for the following components:      Result Value   Glucose, Bld 108 (*)    Calcium 8.1 (*)    Albumin 3.3 (*)    AST 210 (*)    ALT 228 (*)    All other components within normal limits  URINALYSIS, ROUTINE W REFLEX MICROSCOPIC - Abnormal; Notable for the following components:   Hgb urine dipstick TRACE (*)    Bilirubin Urine SMALL (*)    Protein, ur 100 (*)    All other components within normal limits  URINALYSIS, MICROSCOPIC (REFLEX) - Abnormal; Notable for the following components:   Bacteria, UA RARE (*)    All other components within normal limits  SARS CORONAVIRUS 2 BY RT PCR  CBC WITH DIFFERENTIAL/PLATELET    EKG None  Radiology No results found.  Procedures Procedures    Medications Ordered in ED Medications  acetaminophen (TYLENOL) tablet 325 mg (325 mg Oral Given 07/18/22 1928)  acetaminophen (TYLENOL) tablet 650 mg (650 mg Oral Given 07/18/22 1832)  sodium chloride 0.9 % bolus 1,000 mL (0 mLs Intravenous Stopped 07/18/22 2032)  diphenhydrAMINE (BENADRYL) injection 25 mg (25 mg Intravenous Given 07/18/22 1913)  prochlorperazine (COMPAZINE) injection 10 mg (10 mg Intravenous Given 07/18/22 1914)  ketorolac (TORADOL) 15 MG/ML injection 15 mg (15 mg Intravenous Given 07/18/22 1914)  sodium chloride 0.9 % bolus 1,000 mL (0 mLs Intravenous Stopped 07/18/22 2032)  doxycycline (VIBRA-TABS) tablet 100 mg (100 mg Oral Given 07/18/22 2009)    ED Course/ Medical Decision Making/ A&P                           Medical Decision Making Derrick Hurst is a 36 year old old who presents with worsening fever, chills, malaise, diffuse myalgias, and bilateral hip/knee pain and weakness for the past 2 weeks.  For the past week they have been treated with amoxicillin for a positive strep A test but denies any current sore throat or other symptoms of strep throat.  They had no leukocytosis or anemia but did have elevated liver enzymes roughly  5 times upper limit, fever, a trace amount of blood and protein in their urine.  Low suspicion for poststreptococcal glomerulonephritis or other nephrotic or nephritic syndromes.  With their history of a tick bite of unknown duration we are empirically treating with doxycycline.  They also received Tylenol, Compazine, Benadryl, Toradol, and IV fluids which improved their subjective symptoms.  Fevers peaked at 62 F.  Liver enzyme elevation was not consistent with Tylenol overdose, acute/chronic viral hepatitis, or other reversible causes.  Serum albumin is 3.3.  With their improving clinical picture and most likely diagnosis being a  viral syndrome or possible tickborne illness we will plan to treat them with doxycycline 100 mg twice a day for the next 10 days which will cover the remainder of their strep infection and we recommend treat they treat their symptoms with ibuprofen 800 mg 3 times a day.  Problems Addressed: Viral syndrome: acute illness or injury  Amount and/or Complexity of Data Reviewed Labs: ordered. Decision-making details documented in ED Course.  Risk OTC drugs. Prescription drug management.           Final Clinical Impression(s) / ED Diagnoses Final diagnoses:  Viral syndrome    Rx / DC Orders ED Discharge Orders          Ordered    doxycycline (VIBRAMYCIN) 100 MG capsule  2 times daily,   Status:  Discontinued        07/18/22 2235    ibuprofen (ADVIL) 800 MG tablet  3 times daily        07/18/22 2235    doxycycline (VIBRAMYCIN) 100 MG capsule  2 times daily        07/18/22 2238              Rocky Morel, DO 07/18/22 2243    Melene Plan, DO 07/18/22 2318

## 2022-07-18 NOTE — ED Triage Notes (Signed)
Pt complaints of body aches, temp of 100.5 upon arrival. Pt states this has been going on for 2 or 3 weeks.

## 2022-07-18 NOTE — Discharge Instructions (Addendum)
You are seen the emergency department for your worsening malaise, myalgias, fevers, and joint pains.  We did not find any evidence of new or worsening systemic infection.  We did find your liver enzymes were slightly elevated and we would recommend that you have them rechecked next week with primary care physician.  We have attached information for the primary care physicians that work out of the same building as this ER.  Please take doxycycline 100 mg twice daily for the next 10 days and stop taking the amoxicillin.  You can take ibuprofen 800 mg up to 3 times a day for your fever and pain.  We recommend that you avoid taking Tylenol during this time until you have had your liver enzymes rechecked.  If you experience any new or worsening symptoms please return to the emergency department.

## 2022-07-18 NOTE — ED Notes (Signed)
Pt is aware he needs a urine sample, he has a urinal.

## 2023-04-27 ENCOUNTER — Emergency Department (HOSPITAL_BASED_OUTPATIENT_CLINIC_OR_DEPARTMENT_OTHER)
Admission: EM | Admit: 2023-04-27 | Discharge: 2023-04-27 | Discharge status: Against Medical Advice | Payer: Self-pay | Attending: Emergency Medicine | Admitting: Emergency Medicine

## 2023-04-27 ENCOUNTER — Encounter (HOSPITAL_BASED_OUTPATIENT_CLINIC_OR_DEPARTMENT_OTHER): Payer: Self-pay | Admitting: Emergency Medicine

## 2023-04-27 ENCOUNTER — Other Ambulatory Visit: Payer: Self-pay

## 2023-04-27 DIAGNOSIS — Z5321 Procedure and treatment not carried out due to patient leaving prior to being seen by health care provider: Secondary | ICD-10-CM | POA: Insufficient documentation

## 2023-04-27 DIAGNOSIS — K0889 Other specified disorders of teeth and supporting structures: Secondary | ICD-10-CM | POA: Insufficient documentation

## 2023-04-27 NOTE — ED Notes (Signed)
Pt identifies as MALE; registration aware and are trying to correct

## 2023-04-27 NOTE — ED Notes (Signed)
Upon rounding, patient not in room. The unit secretary stated she saw him walk toward the exit. Seems likely the patient eloped.

## 2023-04-27 NOTE — ED Triage Notes (Signed)
Pt reports dental pain (LT upper)

## 2023-04-27 NOTE — ED Notes (Signed)
Gender identity corrected in epic.

## 2023-04-28 NOTE — ED Provider Notes (Signed)
Went to see patient, however, he was not in his room.  Asked RN if patient was still in the department and was informed he eloped.     Lenard Simmer, PA-C 04/28/23 1515    Glyn Ade, MD 04/28/23 1536

## 2023-04-30 ENCOUNTER — Other Ambulatory Visit: Payer: Self-pay

## 2023-04-30 ENCOUNTER — Encounter (HOSPITAL_BASED_OUTPATIENT_CLINIC_OR_DEPARTMENT_OTHER): Payer: Self-pay

## 2023-04-30 ENCOUNTER — Emergency Department (HOSPITAL_BASED_OUTPATIENT_CLINIC_OR_DEPARTMENT_OTHER)
Admission: EM | Admit: 2023-04-30 | Discharge: 2023-04-30 | Disposition: A | Discharge status: Home / Self Care | Payer: Self-pay | Attending: Emergency Medicine | Admitting: Emergency Medicine

## 2023-04-30 DIAGNOSIS — K0889 Other specified disorders of teeth and supporting structures: Secondary | ICD-10-CM

## 2023-04-30 DIAGNOSIS — K029 Dental caries, unspecified: Secondary | ICD-10-CM | POA: Insufficient documentation

## 2023-04-30 MED ORDER — AMOXICILLIN-POT CLAVULANATE 875-125 MG PO TABS
1.0000 | ORAL_TABLET | Freq: Once | ORAL | Status: AC
  Administered 2023-04-30: 1 via ORAL
  Filled 2023-04-30: qty 1

## 2023-04-30 MED ORDER — AMOXICILLIN-POT CLAVULANATE 875-125 MG PO TABS: 1.0000 | ORAL_TABLET | Freq: Two times a day (BID) | ORAL | 0 refills | Status: AC

## 2023-04-30 MED ORDER — KETOROLAC TROMETHAMINE 60 MG/2ML IM SOLN
60.0000 mg | Freq: Once | INTRAMUSCULAR | Status: AC
  Administered 2023-04-30: 60 mg via INTRAMUSCULAR
  Filled 2023-04-30: qty 2

## 2023-04-30 MED ORDER — PROMETHAZINE HCL 25 MG/ML IJ SOLN
25.0000 mg | Freq: Four times a day (QID) | INTRAMUSCULAR | Status: DC | PRN
  Administered 2023-04-30: 25 mg via INTRAMUSCULAR
  Filled 2023-04-30: qty 1

## 2023-04-30 NOTE — ED Provider Notes (Signed)
[place] EMERGENCY DEPARTMENT AT [facility] Provider Note   CSN: [id number] Arrival date & time: 04/30/23  2440     History  Chief Complaint  Patient presents with   Dental Pain    Derrick Hurst is a 37 y.o. male.  The history is provided by the patient.  Dental Pain Derrick Hurst is a 37 y.o. male who presents to the Emergency Department complaining of dental pain.  He presents to the emergency department for several weeks of left-sided dental pain that is now causing him a headache.  He did have a tooth pulled on the right a few weeks ago and states that shortly after his tooth was pulled he started to develop pain on the left side.  He does have local swelling and throbbing at that area.  No fevers.  He does have pain on swallowing at times.  No known medical problems.  No nausea or vomiting.       Home Medications Prior to Admission medications   Medication Sig Start Date End Date Taking? Authorizing Provider  amoxicillin-clavulanate (AUGMENTIN) 875-125 MG tablet Take 1 tablet by mouth every 12 (twelve) hours. 04/30/23  Yes Tilden Fossa, MD  doxycycline (VIBRAMYCIN) 100 MG capsule Take 1 capsule (100 mg total) by mouth 2 (two) times daily. 07/18/22   Rocky Morel, DO  ibuprofen (ADVIL) 800 MG tablet Take 1 tablet (800 mg total) by mouth 3 (three) times daily. 07/18/22   Rocky Morel, DO  menthol-cetylpyridinium (CEPACOL REGULAR STRENGTH) 3 MG lozenge Take 1 lozenge (3 mg total) by mouth as needed for sore throat. 07/12/22   Sloan Leiter, DO      Allergies    Patient has no known allergies.    Review of Systems   Review of Systems  All other systems reviewed and are negative.   Physical Exam Updated Vital Signs BP (!) 105/92   Pulse 72   Temp 98.4 F (36.9 C) (Oral)   Resp 18   SpO2 99%  Physical Exam Vitals and nursing note reviewed.  Constitutional:      Appearance: Normal appearance.  HENT:     Head: Normocephalic and atraumatic.      Comments: EOMI.  There are scattered dental caries.  Tooth #11 is carious and loose.  There is some mild swelling tenderness over the gingiva next to this tooth.  No significant facial swelling.  There is a soft and nontender cyst on the left lower face just anterior to the parotid gland. Cardiovascular:     Rate and Rhythm: Normal rate and regular rhythm.  Pulmonary:     Effort: Pulmonary effort is normal. No respiratory distress.  Musculoskeletal:     Cervical back: Neck supple.  Skin:    General: Skin is warm and dry.  Neurological:     Mental Status: He is alert.     Comments: No asymmetry of facial movements.  Moves all extremities symmetrically.     ED Results / Procedures / Treatments   Labs (all labs ordered are listed, but only abnormal results are displayed) Labs Reviewed - No data to display  EKG None  Radiology No results found.  Procedures Procedures    Medications Ordered in ED Medications  ketorolac (TORADOL) injection 60 mg (has no administration in time range)  promethazine (PHENERGAN) injection 25 mg (has no administration in time range)  amoxicillin-clavulanate (AUGMENTIN) 875-125 MG per tablet 1 tablet (has no administration in time range)    ED Course/  Medical Decision Making/ A&P                             Medical Decision Making Risk Prescription drug management.   Patient here for evaluation of dental pain that is causing a headache.  He is nontoxic-appearing on evaluation.  He has no focal neurologic deficits.  He does have a carious and loose tooth at #11.  There is mild tenderness at the base of this tooth.  No evidence of abscess on examination but patient does feel like there is increase swelling in this area.  Will start antibiotics given his symptoms.  Will treat with Toradol, Phenergan for his headache.  Current clinical picture is not consistent with sepsis, meningitis, subarachnoid hemorrhage.  Discussed importance of dentistry  follow-up.  Discussed continuing OTC analgesics such as Tylenol and ibuprofen according to label instructions for pain while awaiting dentistry follow-up.        Final Clinical Impression(s) / ED Diagnoses Final diagnoses:  Pain, dental    Rx / DC Orders ED Discharge Orders          Ordered    amoxicillin-clavulanate (AUGMENTIN) 875-125 MG tablet  Every 12 hours        04/30/23 0321              Tilden Fossa, MD 04/30/23 [phone]

## 2023-04-30 NOTE — ED Triage Notes (Signed)
Pt reports dental pain on the L upper side, pain is causing him a headache as well
